# Patient Record
Sex: Female | Born: 1985
Health system: Southern US, Community
[De-identification: ages and names within clinical notes are randomized; demographics above are authoritative.]

## PROBLEM LIST (undated history)

## (undated) DIAGNOSIS — J302 Other seasonal allergic rhinitis: Secondary | ICD-10-CM

## (undated) DIAGNOSIS — R112 Nausea with vomiting, unspecified: Secondary | ICD-10-CM

## (undated) DIAGNOSIS — Z9889 Other specified postprocedural states: Secondary | ICD-10-CM

## (undated) DIAGNOSIS — I10 Essential (primary) hypertension: Secondary | ICD-10-CM

## (undated) DIAGNOSIS — K219 Gastro-esophageal reflux disease without esophagitis: Secondary | ICD-10-CM

---

## 2014-12-02 LAB — OB RESULTS CONSOLE GC/CHLAMYDIA
CHLAMYDIA, DNA PROBE: NEGATIVE
Gonorrhea: NEGATIVE

## 2014-12-02 LAB — OB RESULTS CONSOLE ABO/RH: RH Type: NEGATIVE

## 2014-12-02 LAB — OB RESULTS CONSOLE ANTIBODY SCREEN: ANTIBODY SCREEN: NEGATIVE

## 2014-12-02 LAB — OB RESULTS CONSOLE HIV ANTIBODY (ROUTINE TESTING): HIV: NONREACTIVE

## 2014-12-02 LAB — OB RESULTS CONSOLE RPR: RPR: NONREACTIVE

## 2014-12-02 LAB — OB RESULTS CONSOLE HEPATITIS B SURFACE ANTIGEN: HEP B S AG: NEGATIVE

## 2014-12-02 LAB — OB RESULTS CONSOLE RUBELLA ANTIBODY, IGM: RUBELLA: IMMUNE

## 2015-06-16 DIAGNOSIS — Z36 Encounter for antenatal screening of mother: Secondary | ICD-10-CM | POA: Diagnosis not present

## 2015-06-21 ENCOUNTER — Inpatient Hospital Stay (HOSPITAL_COMMUNITY)
Admission: AD | Admit: 2015-06-21 | Discharge: 2015-06-21 | Disposition: A | Discharge status: Home / Self Care | Payer: 59 | Source: Ambulatory Visit | Attending: Obstetrics and Gynecology | Admitting: Obstetrics and Gynecology

## 2015-06-21 ENCOUNTER — Encounter (HOSPITAL_COMMUNITY): Payer: Self-pay | Admitting: *Deleted

## 2015-06-21 DIAGNOSIS — Z3493 Encounter for supervision of normal pregnancy, unspecified, third trimester: Secondary | ICD-10-CM | POA: Insufficient documentation

## 2015-06-21 HISTORY — DX: Gastro-esophageal reflux disease without esophagitis: K21.9

## 2015-06-21 HISTORY — DX: Essential (primary) hypertension: I10

## 2015-06-21 LAB — COMPREHENSIVE METABOLIC PANEL
ALBUMIN: 3 g/dL — AB (ref 3.5–5.0)
ALK PHOS: 164 U/L — AB (ref 38–126)
ALT: 14 U/L (ref 14–54)
ANION GAP: 7 (ref 5–15)
AST: 25 U/L (ref 15–41)
BILIRUBIN TOTAL: 0.4 mg/dL (ref 0.3–1.2)
BUN: 13 mg/dL (ref 6–20)
CALCIUM: 8.8 mg/dL — AB (ref 8.9–10.3)
CO2: 21 mmol/L — ABNORMAL LOW (ref 22–32)
Chloride: 106 mmol/L (ref 101–111)
Creatinine, Ser: 0.89 mg/dL (ref 0.44–1.00)
GFR calc Af Amer: >60 mL/min (ref >60)
GLUCOSE: 119 mg/dL — AB (ref 65–99)
POTASSIUM: 3.5 mmol/L (ref 3.5–5.1)
Sodium: 134 mmol/L — ABNORMAL LOW (ref 135–145)
TOTAL PROTEIN: 6.7 g/dL (ref 6.5–8.1)

## 2015-06-21 LAB — CBC
HEMATOCRIT: 33.7 % — AB (ref 36.0–46.0)
HEMOGLOBIN: 11.1 g/dL — AB (ref 12.0–15.0)
MCH: 28 pg (ref 26.0–34.0)
MCHC: 32.9 g/dL (ref 30.0–36.0)
MCV: 84.9 fL (ref 78.0–100.0)
Platelets: 237 10*3/uL (ref 150–400)
RBC: 3.97 MIL/uL (ref 3.87–5.11)
RDW: 14.2 % (ref 11.5–15.5)
WBC: 11.5 10*3/uL — ABNORMAL HIGH (ref 4.0–10.5)

## 2015-06-21 LAB — PROTEIN / CREATININE RATIO, URINE
CREATININE, URINE: 95 mg/dL
PROTEIN CREATININE RATIO: 0.06 mg/mg{creat} (ref 0.00–0.15)
Total Protein, Urine: 6 mg/dL

## 2015-06-21 NOTE — MAU Note (Signed)
Started feeling contractions about 2000-2200 and went away with rest. 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 Active fetus; no vag bleeding or leaking.

## 2015-06-21 NOTE — Discharge Instructions (Signed)
Fetal Movement Counts °Patient Name: __________________________________________________ Patient Due Date: ____________________ °Performing a fetal movement count is highly recommended in high-risk pregnancies, but it is good for every pregnant woman to do. Your health care provider may ask you to start counting fetal movements at 28 weeks of the pregnancy. Fetal movements often increase: °· After eating a full meal. °· After physical activity. °· After eating or drinking something sweet or cold. °· At rest. °Pay attention to when you feel the baby is most active. This will help you notice a pattern of your baby's sleep and wake cycles and what factors contribute to an increase in fetal movement. It is important to perform a fetal movement count at the same time each day when your baby is normally most active.  °HOW TO COUNT FETAL MOVEMENTS °1. Find a quiet and comfortable area to sit or lie down on your left side. Lying on your left side provides the best blood and oxygen circulation to your baby. °2. Write down the day and time on a sheet of paper or in a journal. °3. Start counting kicks, flutters, swishes, rolls, or jabs in a 2-hour period. You should feel at least 10 movements within 2 hours. °4. If you do not feel 10 movements in 2 hours, wait 2-3 hours and count again. Look for a change in the pattern or not enough counts in 2 hours. °SEEK MEDICAL CARE IF: °· You feel less than 10 counts in 2 hours, tried twice. °· There is no movement in over an hour. °· The pattern is changing or taking longer each day to reach 10 counts in 2 hours. °· You feel the baby is not moving as he or she usually does. °Date: ____________ Movements: ____________ Start time: ____________ Finish time: ____________  °Date: ____________ Movements: ____________ Start time: ____________ Finish time: ____________ °Date: ____________ Movements: ____________ Start time: ____________ Finish time: ____________ °Date: ____________ Movements:  ____________ Start time: ____________ Finish time: ____________ °Date: ____________ Movements: ____________ Start time: ____________ Finish time: ____________ °Date: ____________ Movements: ____________ Start time: ____________ Finish time: ____________ °Date: ____________ Movements: ____________ Start time: ____________ Finish time: ____________ °Date: ____________ Movements: ____________ Start time: ____________ Finish time: ____________  °Date: ____________ Movements: ____________ Start time: ____________ Finish time: ____________ °Date: ____________ Movements: ____________ Start time: ____________ Finish time: ____________ °Date: ____________ Movements: ____________ Start time: ____________ Finish time: ____________ °Date: ____________ Movements: ____________ Start time: ____________ Finish time: ____________ °Date: ____________ Movements: ____________ Start time: ____________ Finish time: ____________ °Date: ____________ Movements: ____________ Start time: ____________ Finish time: ____________ °Date: ____________ Movements: ____________ Start time: ____________ Finish time: ____________  °Date: ____________ Movements: ____________ Start time: ____________ Finish time: ____________ °Date: ____________ Movements: ____________ Start time: ____________ Finish time: ____________ °Date: ____________ Movements: ____________ Start time: ____________ Finish time: ____________ °Date: ____________ Movements: ____________ Start time: ____________ Finish time: ____________ °Date: ____________ Movements: ____________ Start time: ____________ Finish time: ____________ °Date: ____________ Movements: ____________ Start time: ____________ Finish time: ____________ °Date: ____________ Movements: ____________ Start time: ____________ Finish time: ____________  °Date: ____________ Movements: ____________ Start time: ____________ Finish time: ____________ °Date: ____________ Movements: ____________ Start time: ____________ Finish  time: ____________ °Date: ____________ Movements: ____________ Start time: ____________ Finish time: ____________ °Date: ____________ Movements: ____________ Start time: ____________ Finish time: ____________ °Date: ____________ Movements: ____________ Start time: ____________ Finish time: ____________ °Date: ____________ Movements: ____________ Start time: ____________ Finish time: ____________ °Date: ____________ Movements: ____________ Start time: ____________ Finish time: ____________  °Date: ____________ Movements: ____________ Start time: ____________ Finish   time: ____________ Date: ____________ Movements: ____________ Start time: ____________ Finish time: ____________ Date: ____________ Movements: ____________ Start time: ____________ Finish time: ____________ Date: ____________ Movements: ____________ Start time: ____________ Finish time: ____________ Date: ____________ Movements: ____________ Start time: ____________ Finish time: ____________ Date: ____________ Movements: ____________ Start time: ____________ Finish time: ____________ Date: ____________ Movements: ____________ Start time: ____________ Finish time: ____________  Date: ____________ Movements: ____________ Start time: ____________ Finish time: ____________ Date: ____________ Movements: ____________ Start time: ____________ Finish time: ____________ Date: ____________ Movements: ____________ Start time: ____________ Finish time: ____________ Date: ____________ Movements: ____________ Start time: ____________ Finish time: ____________ Date: ____________ Movements: ____________ Start time: ____________ Finish time: ____________ Date: ____________ Movements: ____________ Start time: ____________ Finish time: ____________ Date: ____________ Movements: ____________ Start time: ____________ Finish time: ____________  Date: ____________ Movements: ____________ Start time: ____________ Finish time: ____________ Date: ____________  Movements: ____________ Start time: ____________ Finish time: ____________ Date: ____________ Movements: ____________ Start time: ____________ Finish time: ____________ Date: ____________ Movements: ____________ Start time: ____________ Finish time: ____________ Date: ____________ Movements: ____________ Start time: ____________ Finish time: ____________ Date: ____________ Movements: ____________ Start time: ____________ Finish time: ____________ Date: ____________ Movements: ____________ Start time: ____________ Finish time: ____________  Date: ____________ Movements: ____________ Start time: ____________ Finish time: ____________ Date: ____________ Movements: ____________ Start time: ____________ Finish time: ____________ Date: ____________ Movements: ____________ Start time: ____________ Finish time: ____________ Date: ____________ Movements: ____________ Start time: ____________ Finish time: ____________ Date: ____________ Movements: ____________ Start time: ____________ Finish time: ____________ Date: ____________ Movements: ____________ Start time: ____________ Finish time: ____________   This information is not intended to replace advice given to you by your health care provider. Make sure you discuss any questions you have with your health care provider.   Document Released: 05/23/2006 Document Revised: 05/14/2014 Document Reviewed: 02/18/2012 Elsevier Interactive Patient Education 2016 [employer] Braxton Hicks Contractions Contractions of the uterus can occur throughout pregnancy. Contractions are not always a sign that you are in labor.  WHAT ARE BRAXTON HICKS CONTRACTIONS?  Contractions that occur before labor are called Braxton Hicks contractions, or false labor. Toward the end of pregnancy (32-34 weeks), these contractions can develop more often and may become more forceful. This is not true labor because these contractions do not result in opening (dilatation) and thinning of  the cervix. They are sometimes difficult to tell apart from true labor because these contractions can be forceful and people have different pain tolerances. You should not feel embarrassed if you go to the hospital with false labor. Sometimes, the only way to tell if you are in true labor is for your health care provider to look for changes in the cervix. If there are no prenatal problems or other health problems associated with the pregnancy, it is completely safe to be sent home with false labor and await the onset of true labor. HOW CAN YOU TELL THE DIFFERENCE BETWEEN TRUE AND FALSE LABOR? False Labor  The contractions of false labor are usually shorter and not as hard as those of true labor.   The contractions are usually irregular.   The contractions are often felt in the front of the lower abdomen and in the groin.   The contractions may go away when you walk around or change positions while lying down.   The contractions get weaker and are shorter lasting as time goes on.   The contractions do not usually become progressively stronger, regular, and closer together as with true labor.  True Labor 5. Contractions in true   labor last 30-70 seconds, become very regular, usually become more intense, and increase in frequency.  6. The contractions do not go away with walking.  7. The discomfort is usually felt in the top of the uterus and spreads to the lower abdomen and low back.  8. True labor can be determined by your health care provider with an exam. This will show that the cervix is dilating and getting thinner.  WHAT TO REMEMBER  Keep up with your usual exercises and follow other instructions given by your health care provider.   Take medicines as directed by your health care provider.   Keep your regular prenatal appointments.   Eat and drink lightly if you think you are going into labor.   If Braxton Hicks contractions are making you uncomfortable:   Change  your position from lying down or resting to walking, or from walking to resting.   Sit and rest in a tub of warm water.   Drink 2-3 glasses of water. Dehydration may cause these contractions.   Do slow and deep breathing several times an hour.  WHEN SHOULD I SEEK IMMEDIATE MEDICAL CARE? Seek immediate medical care if:  Your contractions become stronger, more regular, and closer together.   You have fluid leaking or gushing from your vagina.   You have a fever.   You pass blood-tinged mucus.   You have vaginal bleeding.   You have continuous abdominal pain.   You have low back pain that you never had before.   You feel your baby's head pushing down and causing pelvic pressure.   Your baby is not moving as much as it used to.    This information is not intended to replace advice given to you by your health care provider. Make sure you discuss any questions you have with your health care provider.   Document Released: 04/23/2005 Document Revised: 04/28/2013 Document Reviewed: 02/02/2013 Elsevier Interactive Patient Education 2016 [employer] Pregnancy The third trimester is from week 29 through week 42, months 7 through 9. The third trimester is a time when the fetus is growing rapidly. At the end of the ninth month, the fetus is about 20 inches in length and weighs 6-10 pounds.  BODY CHANGES Your body goes through many changes during pregnancy. The changes vary from woman to woman.   Your weight will continue to increase. You can expect to gain 25-35 pounds (11-16 kg) by the end of the pregnancy.  You may begin to get stretch marks on your hips, abdomen, and breasts.  You may urinate more often because the fetus is moving lower into your pelvis and pressing on your bladder.  You may develop or continue to have heartburn as a result of your pregnancy.  You may develop constipation because certain hormones are causing the muscles that push  waste through your intestines to slow down.  You may develop hemorrhoids or swollen, bulging veins (varicose veins).  You may have pelvic pain because of the weight gain and pregnancy hormones relaxing your joints between the bones in your pelvis. Backaches may result from overexertion of the muscles supporting your posture.  You may have changes in your hair. These can include thickening of your hair, rapid growth, and changes in texture. Some women also have hair loss during or after pregnancy, or hair that feels dry or thin. Your hair will most likely return to normal after your baby is born.  Your breasts will continue to grow and  be tender. A yellow discharge may leak from your breasts called colostrum.  Your belly button may stick out.  You may feel short of breath because of your expanding uterus.  You may notice the fetus "dropping," or moving lower in your abdomen.  You may have a bloody mucus discharge. This usually occurs a few days to a week before labor begins.  Your cervix becomes thin and soft (effaced) near your due date. WHAT TO EXPECT AT YOUR PRENATAL EXAMS  You will have prenatal exams every 2 weeks until week 36. Then, you will have weekly prenatal exams. During a routine prenatal visit: 9. You will be weighed to make sure you and the fetus are growing normally. 10. Your blood pressure is taken. 11. Your abdomen will be measured to track your baby's growth. 12. The fetal heartbeat will be listened to. 13. Any test results from the previous visit will be discussed. 14. You may have a cervical check near your due date to see if you have effaced. At around 36 weeks, your caregiver will check your cervix. At the same time, your caregiver will also perform a test on the secretions of the vaginal tissue. This test is to determine if a type of bacteria, Group B streptococcus, is present. Your caregiver will explain this further. Your caregiver may ask you:  What your birth  plan is.  How you are feeling.  If you are feeling the baby move.  If you have had any abnormal symptoms, such as leaking fluid, bleeding, severe headaches, or abdominal cramping.  If you are using any tobacco products, including cigarettes, chewing tobacco, and electronic cigarettes.  If you have any questions. Other tests or screenings that may be performed during your third trimester include:  Blood tests that check for low iron levels (anemia).  Fetal testing to check the health, activity level, and growth of the fetus. Testing is done if you have certain medical conditions or if there are problems during the pregnancy.  HIV (human immunodeficiency virus) testing. If you are at high risk, you may be screened for HIV during your third trimester of pregnancy. FALSE LABOR You may feel small, irregular contractions that eventually go away. These are called Braxton Hicks contractions, or false labor. Contractions may last for hours, days, or even weeks before true labor sets in. If contractions come at regular intervals, intensify, or become painful, it is best to be seen by your caregiver.  SIGNS OF LABOR   Menstrual-like cramps.  Contractions that are 5 minutes apart or less.  Contractions that start on the top of the uterus and spread down to the lower abdomen and back.  A sense of increased pelvic pressure or back pain.  A watery or bloody mucus discharge that comes from the vagina. If you have any of these signs before the 37th week of pregnancy, call your caregiver right away. You need to go to the hospital to get checked immediately. HOME CARE INSTRUCTIONS   Avoid all smoking, herbs, alcohol, and unprescribed drugs. These chemicals affect the formation and growth of the baby.  Do not use any tobacco products, including cigarettes, chewing tobacco, and electronic cigarettes. If you need help quitting, ask your health care provider. You may receive counseling support and other  resources to help you quit.  Follow your caregiver's instructions regarding medicine use. There are medicines that are either safe or unsafe to take during pregnancy.  Exercise only as directed by your caregiver. Experiencing uterine cramps is  a good sign to stop exercising.  Continue to eat regular, healthy meals.  Wear a good support bra for breast tenderness.  Do not use hot tubs, steam rooms, or saunas.  Wear your seat belt at all times when driving.  Avoid raw meat, uncooked cheese, cat litter boxes, and soil used by cats. These carry germs that can cause birth defects in the baby.  Take your prenatal vitamins.  Take 1500-2000 mg of calcium daily starting at the 20th week of pregnancy until you deliver your baby.  Try taking a stool softener (if your caregiver approves) if you develop constipation. Eat more high-fiber foods, such as fresh vegetables or fruit and whole grains. Drink plenty of fluids to keep your urine clear or pale yellow.  Take warm sitz baths to soothe any pain or discomfort caused by hemorrhoids. Use hemorrhoid cream if your caregiver approves.  If you develop varicose veins, wear support hose. Elevate your feet for 15 minutes, 3-4 times a day. Limit salt in your diet.  Avoid heavy lifting, wear low heal shoes, and practice good posture.  Rest a lot with your legs elevated if you have leg cramps or low back pain.  Visit your dentist if you have not gone during your pregnancy. Use a soft toothbrush to brush your teeth and be gentle when you floss.  A sexual relationship may be continued unless your caregiver directs you otherwise.  Do not travel far distances unless it is absolutely necessary and only with the approval of your caregiver.  Take prenatal classes to understand, practice, and ask questions about the labor and delivery.  Make a trial run to the hospital.  Pack your hospital bag.  Prepare the baby's nursery.  Continue to go to all your  prenatal visits as directed by your caregiver. SEEK MEDICAL CARE IF:  You are unsure if you are in labor or if your water has broken.  You have dizziness.  You have mild pelvic cramps, pelvic pressure, or nagging pain in your abdominal area.  You have persistent nausea, vomiting, or diarrhea.  You have a bad smelling vaginal discharge.  You have pain with urination. SEEK IMMEDIATE MEDICAL CARE IF:   You have a fever.  You are leaking fluid from your vagina.  You have spotting or bleeding from your vagina.  You have severe abdominal cramping or pain.  You have rapid weight loss or gain.  You have shortness of breath with chest pain.  You notice sudden or extreme swelling of your face, hands, ankles, feet, or legs.  You have not felt your baby move in over an hour.  You have severe headaches that do not go away with medicine.  You have vision changes.   This information is not intended to replace advice given to you by your health care provider. Make sure you discuss any questions you have with your health care provider.   Document Released: 04/17/2001 Document Revised: 05/14/2014 Document Reviewed: 06/24/2012 Elsevier Interactive Patient Education [employer].

## 2015-07-04 NOTE — H&P (Signed)
Vicki Rodriguez is a 30 y.o. female presenting for repeat C/S with BTL; declines TOLAC.  Antepartum course uncomplicated.  Rh negative.  GBS negative.  Maternal Medical History:  Prenatal complications: no prenatal complications Prenatal Complications - Diabetes: none.    OB History    Gravida Para Term Preterm AB TAB SAB Ectopic Multiple Living   2 1 1       1      Past Medical History  Diagnosis Date  . Hypertension     while in nursing school  . GERD (gastroesophageal reflux disease)    Past Surgical History  Procedure Laterality Date  . Cesarean section     Family History: family history is not on file. Social History:  reports that she has never smoked. She does not have any smokeless tobacco history on file. She reports that she does not drink alcohol or use illicit drugs.   Prenatal Transfer Tool  Maternal Diabetes: No Genetic Screening: Normal Maternal Ultrasounds/Referrals: Normal Fetal Ultrasounds or other Referrals:  None Maternal Substance Abuse:  No Significant Maternal Medications:  None Significant Maternal Lab Results:  Lab values include: Group B Strep negative, Rh negative Other Comments:  None  ROS    There were no vitals taken for this visit. Maternal Exam:  Abdomen: Patient reports no abdominal tenderness. Surgical scars: low transverse.   Fundal height is c/w dates.   Estimated fetal weight is 8#.       Physical Exam  Constitutional: She is oriented to person, place, and time. She appears well-developed and well-nourished.  GI: Soft. There is no rebound and no guarding.  Neurological: She is alert and oriented to person, place, and time.  Skin: Skin is warm and dry.  Psychiatric: She has a normal mood and affect. Her behavior is normal.    Prenatal labs: ABO, Rh:   Antibody:   Rubella:   RPR:    HBsAg:    HIV:    GBS:     Assessment/Plan: 31yo G2P1001 at 39 weeks for repeat C/S and BTL -Patient is counseled re: risk of bleeding,  infection, scarring and damage to surrounding structures.  She understands the risk of failure/ectopic and regret with BTL.  All questions were answered and the patient wishes to proceed.  Vicki Rodriguez 07/04/2015, 9:22 PM

## 2015-07-05 ENCOUNTER — Encounter (HOSPITAL_COMMUNITY): Payer: Self-pay

## 2015-07-05 NOTE — Patient Instructions (Signed)
Your procedure is scheduled on:  Tomorrow, July 07, 2015  Enter through the Main Entrance of [facility] at:  6:00 AM  Pick up the phone at the desk and dial [phone].  Call this number if you have problems the morning of surgery: [phone].  Remember:  Do NOT eat food or drink after:  Midnight tonight  Take these medicines the morning of surgery with a SIP OF WATER:  Zantac  Do NOT wear jewelry (body piercing), metal hair clips/bobby pins, or nail polish. Do NOT wear lotions, powders, or perfumes.  You may wear deoderant. Do NOT shave for 48 hours prior to surgery. Do NOT bring valuables to the hospital.  Leave suitcase in car.  After surgery it may be brought to your room.  For patients admitted to the hospital, checkout time is 11:00 AM the day of discharge.

## 2015-07-06 ENCOUNTER — Encounter (HOSPITAL_COMMUNITY)
Admission: RE | Admit: 2015-07-06 | Discharge: 2015-07-06 | Disposition: A | Discharge status: Home / Self Care | Payer: 59 | Source: Ambulatory Visit | Attending: Obstetrics & Gynecology | Admitting: Obstetrics & Gynecology

## 2015-07-06 ENCOUNTER — Encounter (HOSPITAL_COMMUNITY): Payer: Self-pay

## 2015-07-06 DIAGNOSIS — O9962 Diseases of the digestive system complicating childbirth: Secondary | ICD-10-CM | POA: Diagnosis not present

## 2015-07-06 DIAGNOSIS — O34211 Maternal care for low transverse scar from previous cesarean delivery: Secondary | ICD-10-CM | POA: Diagnosis not present

## 2015-07-06 DIAGNOSIS — Z3A39 39 weeks gestation of pregnancy: Secondary | ICD-10-CM | POA: Diagnosis not present

## 2015-07-06 DIAGNOSIS — O26893 Other specified pregnancy related conditions, third trimester: Secondary | ICD-10-CM | POA: Diagnosis not present

## 2015-07-06 DIAGNOSIS — K219 Gastro-esophageal reflux disease without esophagitis: Secondary | ICD-10-CM | POA: Diagnosis not present

## 2015-07-06 DIAGNOSIS — Z6791 Unspecified blood type, Rh negative: Secondary | ICD-10-CM | POA: Diagnosis not present

## 2015-07-06 HISTORY — DX: Other specified postprocedural states: R11.2

## 2015-07-06 HISTORY — DX: Nausea with vomiting, unspecified: Z98.890

## 2015-07-06 LAB — CBC
HEMATOCRIT: 32.5 % — AB (ref 36.0–46.0)
HEMOGLOBIN: 10.6 g/dL — AB (ref 12.0–15.0)
MCH: 27.2 pg (ref 26.0–34.0)
MCHC: 32.6 g/dL (ref 30.0–36.0)
MCV: 83.5 fL (ref 78.0–100.0)
Platelets: 198 10*3/uL (ref 150–400)
RBC: 3.89 MIL/uL (ref 3.87–5.11)
RDW: 14.1 % (ref 11.5–15.5)
WBC: 10.9 10*3/uL — AB (ref 4.0–10.5)

## 2015-07-07 ENCOUNTER — Inpatient Hospital Stay (HOSPITAL_COMMUNITY): Payer: 59 | Admitting: Anesthesiology

## 2015-07-07 ENCOUNTER — Encounter (HOSPITAL_COMMUNITY)
Admission: RE | Disposition: A | Discharge status: Home / Self Care | Payer: Self-pay | Source: Ambulatory Visit | Attending: Obstetrics & Gynecology

## 2015-07-07 ENCOUNTER — Encounter (HOSPITAL_COMMUNITY): Payer: Self-pay | Admitting: Obstetrics

## 2015-07-07 ENCOUNTER — Inpatient Hospital Stay (HOSPITAL_COMMUNITY)
Admission: RE | Admit: 2015-07-07 | Discharge: 2015-07-10 | DRG: 766 | Disposition: A | Discharge status: Home / Self Care | Payer: 59 | Source: Ambulatory Visit | Attending: Obstetrics & Gynecology | Admitting: Obstetrics & Gynecology

## 2015-07-07 DIAGNOSIS — O34211 Maternal care for low transverse scar from previous cesarean delivery: Secondary | ICD-10-CM | POA: Diagnosis not present

## 2015-07-07 DIAGNOSIS — O26893 Other specified pregnancy related conditions, third trimester: Secondary | ICD-10-CM | POA: Diagnosis present

## 2015-07-07 DIAGNOSIS — K219 Gastro-esophageal reflux disease without esophagitis: Secondary | ICD-10-CM | POA: Diagnosis present

## 2015-07-07 DIAGNOSIS — Z3A39 39 weeks gestation of pregnancy: Secondary | ICD-10-CM

## 2015-07-07 DIAGNOSIS — Z98891 History of uterine scar from previous surgery: Secondary | ICD-10-CM

## 2015-07-07 DIAGNOSIS — O9962 Diseases of the digestive system complicating childbirth: Secondary | ICD-10-CM | POA: Diagnosis present

## 2015-07-07 DIAGNOSIS — Z6791 Unspecified blood type, Rh negative: Secondary | ICD-10-CM | POA: Diagnosis not present

## 2015-07-07 HISTORY — DX: Other seasonal allergic rhinitis: J30.2

## 2015-07-07 LAB — RPR: RPR Ser Ql: NONREACTIVE

## 2015-07-07 SURGERY — Surgical Case
Anesthesia: Spinal | Site: Abdomen | Laterality: Bilateral

## 2015-07-07 MED ORDER — PRENATAL MULTIVITAMIN CH
1.0000 | ORAL_TABLET | Freq: Every day | ORAL | Status: DC
  Administered 2015-07-08 – 2015-07-09 (×2): 1 via ORAL
  Filled 2015-07-07 (×2): qty 1

## 2015-07-07 MED ORDER — LACTATED RINGERS IV SOLN: INTRAVENOUS | Status: DC

## 2015-07-07 MED ORDER — ACETAMINOPHEN 325 MG PO TABS: 650.0000 mg | ORAL_TABLET | ORAL | Status: DC | PRN

## 2015-07-07 MED ORDER — FENTANYL CITRATE (PF) 100 MCG/2ML IJ SOLN
INTRAMUSCULAR | Status: DC | PRN
  Administered 2015-07-07: 12.5 ug via INTRAVENOUS

## 2015-07-07 MED ORDER — MEPERIDINE HCL 25 MG/ML IJ SOLN: 6.2500 mg | INTRAMUSCULAR | Status: DC | PRN

## 2015-07-07 MED ORDER — LACTATED RINGERS IV SOLN
INTRAVENOUS | Status: DC
  Administered 2015-07-07 (×2): via INTRAVENOUS

## 2015-07-07 MED ORDER — OXYTOCIN 10 UNIT/ML IJ SOLN: 2.5000 [IU]/h | INTRAVENOUS | Status: AC

## 2015-07-07 MED ORDER — LACTATED RINGERS IV SOLN
INTRAVENOUS | Status: DC
Start: 2015-07-07 — End: 2015-07-10
  Administered 2015-07-07: 16:00:00 via INTRAVENOUS

## 2015-07-07 MED ORDER — LACTATED RINGERS IV SOLN
INTRAVENOUS | Status: DC | PRN
  Administered 2015-07-07: 08:00:00 via INTRAVENOUS

## 2015-07-07 MED ORDER — MORPHINE SULFATE (PF) 0.5 MG/ML IJ SOLN
INTRAMUSCULAR | Status: AC
  Filled 2015-07-07: qty 10

## 2015-07-07 MED ORDER — SCOPOLAMINE 1 MG/3DAYS TD PT72
MEDICATED_PATCH | TRANSDERMAL | Status: AC
  Administered 2015-07-07: 1.5 mg via TRANSDERMAL
  Filled 2015-07-07: qty 1

## 2015-07-07 MED ORDER — SCOPOLAMINE 1 MG/3DAYS TD PT72
1.0000 | MEDICATED_PATCH | Freq: Once | TRANSDERMAL | Status: DC
  Filled 2015-07-07: qty 1

## 2015-07-07 MED ORDER — SENNOSIDES-DOCUSATE SODIUM 8.6-50 MG PO TABS
2.0000 | ORAL_TABLET | ORAL | Status: DC
  Administered 2015-07-08 – 2015-07-10 (×3): 2 via ORAL
  Filled 2015-07-07 (×3): qty 2

## 2015-07-07 MED ORDER — ONDANSETRON HCL 4 MG/2ML IJ SOLN: 4.0000 mg | Freq: Three times a day (TID) | INTRAMUSCULAR | Status: DC | PRN

## 2015-07-07 MED ORDER — SIMETHICONE 80 MG PO CHEW
80.0000 mg | CHEWABLE_TABLET | ORAL | Status: DC | PRN
Start: 2015-07-07 — End: 2015-07-10

## 2015-07-07 MED ORDER — OXYCODONE-ACETAMINOPHEN 5-325 MG PO TABS
1.0000 | ORAL_TABLET | ORAL | Status: DC | PRN
  Administered 2015-07-07 – 2015-07-08 (×3): 1 via ORAL
  Filled 2015-07-07 (×4): qty 1

## 2015-07-07 MED ORDER — SODIUM CHLORIDE 0.9% FLUSH: 3.0000 mL | INTRAVENOUS | Status: DC | PRN

## 2015-07-07 MED ORDER — CEFAZOLIN SODIUM-DEXTROSE 2-3 GM-% IV SOLR
INTRAVENOUS | Status: AC
  Filled 2015-07-07: qty 50

## 2015-07-07 MED ORDER — IBUPROFEN 600 MG PO TABS
600.0000 mg | ORAL_TABLET | Freq: Four times a day (QID) | ORAL | Status: DC
Start: 2015-07-07 — End: 2015-07-10
  Administered 2015-07-08 – 2015-07-10 (×10): 600 mg via ORAL
  Filled 2015-07-07 (×10): qty 1

## 2015-07-07 MED ORDER — LANOLIN HYDROUS EX OINT: 1.0000 "application " | TOPICAL_OINTMENT | CUTANEOUS | Status: DC | PRN

## 2015-07-07 MED ORDER — PHENYLEPHRINE 8 MG IN D5W 100 ML (0.08MG/ML) PREMIX OPTIME
INJECTION | INTRAVENOUS | Status: AC
  Filled 2015-07-07: qty 100

## 2015-07-07 MED ORDER — OXYTOCIN 10 UNIT/ML IJ SOLN
INTRAMUSCULAR | Status: AC
  Filled 2015-07-07: qty 4

## 2015-07-07 MED ORDER — KETOROLAC TROMETHAMINE 30 MG/ML IJ SOLN
30.0000 mg | Freq: Four times a day (QID) | INTRAMUSCULAR | Status: AC | PRN
  Administered 2015-07-07 (×2): 30 mg via INTRAVENOUS
  Filled 2015-07-07: qty 1

## 2015-07-07 MED ORDER — NALOXONE HCL 2 MG/2ML IJ SOSY
1.0000 ug/kg/h | PREFILLED_SYRINGE | INTRAMUSCULAR | Status: DC | PRN
Start: 2015-07-07 — End: 2015-07-10
  Filled 2015-07-07: qty 2

## 2015-07-07 MED ORDER — ZOLPIDEM TARTRATE 5 MG PO TABS: 5.0000 mg | ORAL_TABLET | Freq: Every evening | ORAL | Status: DC | PRN

## 2015-07-07 MED ORDER — BUPIVACAINE IN DEXTROSE 0.75-8.25 % IT SOLN
INTRATHECAL | Status: DC | PRN
Start: 2015-07-07 — End: 2015-07-07
  Administered 2015-07-07: 1.4 mL via INTRATHECAL

## 2015-07-07 MED ORDER — CEFAZOLIN SODIUM-DEXTROSE 2-3 GM-% IV SOLR
2.0000 g | INTRAVENOUS | Status: AC
  Administered 2015-07-07: 2 g via INTRAVENOUS

## 2015-07-07 MED ORDER — PHENYLEPHRINE 8 MG IN D5W 100 ML (0.08MG/ML) PREMIX OPTIME
INJECTION | INTRAVENOUS | Status: DC | PRN
  Administered 2015-07-07: 40 ug/min via INTRAVENOUS

## 2015-07-07 MED ORDER — NALBUPHINE HCL 10 MG/ML IJ SOLN: 5.0000 mg | INTRAMUSCULAR | Status: DC | PRN

## 2015-07-07 MED ORDER — SIMETHICONE 80 MG PO CHEW
80.0000 mg | CHEWABLE_TABLET | ORAL | Status: DC
  Administered 2015-07-08 – 2015-07-10 (×3): 80 mg via ORAL
  Filled 2015-07-07 (×3): qty 1

## 2015-07-07 MED ORDER — DIPHENHYDRAMINE HCL 50 MG/ML IJ SOLN: 12.5000 mg | INTRAMUSCULAR | Status: DC | PRN

## 2015-07-07 MED ORDER — ONDANSETRON HCL 4 MG/2ML IJ SOLN
INTRAMUSCULAR | Status: AC
  Filled 2015-07-07: qty 2

## 2015-07-07 MED ORDER — MENTHOL 3 MG MT LOZG: 1.0000 | LOZENGE | OROMUCOSAL | Status: DC | PRN

## 2015-07-07 MED ORDER — FENTANYL CITRATE (PF) 100 MCG/2ML IJ SOLN: 25.0000 ug | INTRAMUSCULAR | Status: DC | PRN

## 2015-07-07 MED ORDER — TETANUS-DIPHTH-ACELL PERTUSSIS 5-2.5-18.5 LF-MCG/0.5 IM SUSP: 0.5000 mL | Freq: Once | INTRAMUSCULAR | Status: DC

## 2015-07-07 MED ORDER — ERYTHROMYCIN 5 MG/GM OP OINT
TOPICAL_OINTMENT | OPHTHALMIC | Status: AC
  Filled 2015-07-07: qty 1

## 2015-07-07 MED ORDER — NALOXONE HCL 0.4 MG/ML IJ SOLN: 0.4000 mg | INTRAMUSCULAR | Status: DC | PRN

## 2015-07-07 MED ORDER — NALBUPHINE HCL 10 MG/ML IJ SOLN: 5.0000 mg | Freq: Once | INTRAMUSCULAR | Status: DC | PRN

## 2015-07-07 MED ORDER — DIPHENHYDRAMINE HCL 25 MG PO CAPS: 25.0000 mg | ORAL_CAPSULE | ORAL | Status: DC | PRN

## 2015-07-07 MED ORDER — DIPHENHYDRAMINE HCL 25 MG PO CAPS: 25.0000 mg | ORAL_CAPSULE | Freq: Four times a day (QID) | ORAL | Status: DC | PRN

## 2015-07-07 MED ORDER — KETOROLAC TROMETHAMINE 30 MG/ML IJ SOLN
INTRAMUSCULAR | Status: AC
  Administered 2015-07-07: 30 mg via INTRAVENOUS
  Filled 2015-07-07: qty 1

## 2015-07-07 MED ORDER — VITAMIN K1 1 MG/0.5ML IJ SOLN
INTRAMUSCULAR | Status: AC
  Filled 2015-07-07: qty 0.5

## 2015-07-07 MED ORDER — OXYTOCIN 10 UNIT/ML IJ SOLN
40.0000 [IU] | INTRAMUSCULAR | Status: DC | PRN
  Administered 2015-07-07: 40 [IU] via INTRAVENOUS

## 2015-07-07 MED ORDER — FENTANYL CITRATE (PF) 100 MCG/2ML IJ SOLN
INTRAMUSCULAR | Status: AC
  Filled 2015-07-07: qty 2

## 2015-07-07 MED ORDER — WITCH HAZEL-GLYCERIN EX PADS: 1.0000 "application " | MEDICATED_PAD | CUTANEOUS | Status: DC | PRN

## 2015-07-07 MED ORDER — SCOPOLAMINE 1 MG/3DAYS TD PT72
1.0000 | MEDICATED_PATCH | Freq: Once | TRANSDERMAL | Status: DC
  Administered 2015-07-07: 1.5 mg via TRANSDERMAL

## 2015-07-07 MED ORDER — OXYCODONE-ACETAMINOPHEN 5-325 MG PO TABS
2.0000 | ORAL_TABLET | ORAL | Status: DC | PRN
  Administered 2015-07-08 – 2015-07-10 (×8): 2 via ORAL
  Filled 2015-07-07 (×6): qty 2

## 2015-07-07 MED ORDER — MORPHINE SULFATE (PF) 0.5 MG/ML IJ SOLN
INTRAMUSCULAR | Status: DC | PRN
  Administered 2015-07-07: .1 mg via INTRATHECAL

## 2015-07-07 MED ORDER — ONDANSETRON HCL 4 MG/2ML IJ SOLN
INTRAMUSCULAR | Status: DC | PRN
  Administered 2015-07-07: 4 mg via INTRAVENOUS

## 2015-07-07 MED ORDER — KETOROLAC TROMETHAMINE 30 MG/ML IJ SOLN: 30.0000 mg | Freq: Four times a day (QID) | INTRAMUSCULAR | Status: AC | PRN

## 2015-07-07 MED ORDER — SIMETHICONE 80 MG PO CHEW
80.0000 mg | CHEWABLE_TABLET | Freq: Three times a day (TID) | ORAL | Status: DC
  Administered 2015-07-07 – 2015-07-10 (×6): 80 mg via ORAL
  Filled 2015-07-07 (×6): qty 1

## 2015-07-07 MED ORDER — SODIUM CHLORIDE 0.9 % IR SOLN
Status: DC | PRN
  Administered 2015-07-07: 1000 mL

## 2015-07-07 MED ORDER — DIBUCAINE 1 % RE OINT: 1.0000 "application " | TOPICAL_OINTMENT | RECTAL | Status: DC | PRN

## 2015-07-07 SURGICAL SUPPLY — 35 items
BENZOIN TINCTURE PRP APPL 2/3 (GAUZE/BANDAGES/DRESSINGS) ×3 IMPLANT
CLAMP CORD UMBIL (MISCELLANEOUS) IMPLANT
CLOSURE STERI STRIP 1/2 X4 (GAUZE/BANDAGES/DRESSINGS) ×3 IMPLANT
CLOTH BEACON ORANGE TIMEOUT ST (SAFETY) ×3 IMPLANT
DRSG OPSITE POSTOP 4X10 (GAUZE/BANDAGES/DRESSINGS) ×3 IMPLANT
DURAPREP 26ML APPLICATOR (WOUND CARE) ×3 IMPLANT
ELECT REM PT RETURN 9FT ADLT (ELECTROSURGICAL) ×3
ELECTRODE REM PT RTRN 9FT ADLT (ELECTROSURGICAL) ×1 IMPLANT
EXTRACTOR VACUUM KIWI (MISCELLANEOUS) IMPLANT
EXTRACTOR VACUUM M CUP 4 TUBE (SUCTIONS) IMPLANT
EXTRACTOR VACUUM M CUP 4' TUBE (SUCTIONS)
GLOVE BIO SURGEON STRL SZ 6 (GLOVE) ×3 IMPLANT
GLOVE BIOGEL PI IND STRL 6 (GLOVE) ×2 IMPLANT
GLOVE BIOGEL PI IND STRL 7.0 (GLOVE) ×1 IMPLANT
GLOVE BIOGEL PI INDICATOR 6 (GLOVE) ×4
GLOVE BIOGEL PI INDICATOR 7.0 (GLOVE) ×2
GOWN STRL REUS W/TWL LRG LVL3 (GOWN DISPOSABLE) ×6 IMPLANT
KIT ABG SYR 3ML LUER SLIP (SYRINGE) ×3 IMPLANT
LIQUID BAND (GAUZE/BANDAGES/DRESSINGS) IMPLANT
NEEDLE HYPO 25X5/8 SAFETYGLIDE (NEEDLE) ×3 IMPLANT
NS IRRIG 1000ML POUR BTL (IV SOLUTION) ×3 IMPLANT
PACK C SECTION WH (CUSTOM PROCEDURE TRAY) ×3 IMPLANT
PAD OB MATERNITY 4.3X12.25 (PERSONAL CARE ITEMS) ×3 IMPLANT
PENCIL SMOKE EVAC W/HOLSTER (ELECTROSURGICAL) ×3 IMPLANT
STRIP CLOSURE SKIN 1/2X4 (GAUZE/BANDAGES/DRESSINGS) ×2 IMPLANT
SUT CHROMIC 0 CTX 36 (SUTURE) ×9 IMPLANT
SUT MON AB 2-0 CT1 27 (SUTURE) ×3 IMPLANT
SUT PDS AB 0 CT1 27 (SUTURE) IMPLANT
SUT PLAIN 0 NONE (SUTURE) IMPLANT
SUT PLAIN 2 0 (SUTURE) ×2
SUT PLAIN ABS 2-0 CT1 27XMFL (SUTURE) ×1 IMPLANT
SUT VIC AB 0 CT1 36 (SUTURE) IMPLANT
SUT VIC AB 4-0 KS 27 (SUTURE) IMPLANT
TOWEL OR 17X24 6PK STRL BLUE (TOWEL DISPOSABLE) ×3 IMPLANT
TRAY FOLEY CATH SILVER 14FR (SET/KITS/TRAYS/PACK) IMPLANT

## 2015-07-07 NOTE — Lactation Note (Signed)
This note was copied from a baby's chart. Lactation Consultation Note  Baby 7 hours and breastfed x3.  NICU RN. Mother states she got the flu after leaving the hospital w/ her first child and was not allowed to care for her so started pumping and had trouble w/ her milk supply. States she used a #20NS with first child due to soreness. Encouraged her to call if she starts getting sore. States she knows how to hand express. Encouraged STS and feeding on demand. Mom encouraged to feed baby 8-12 times/24 hours and with feeding cues.  Mom made aware of O/P services, breastfeeding support groups, community resources, and our phone # for post-discharge questions.  Mother has breast pump kit in room.   Patient Name: Vicki Rodriguez [id number] Date: 07/07/2015 Reason for consult: Initial assessment   Maternal Data Has patient been taught Hand Expression?: Yes Does the patient have breastfeeding experience prior to this delivery?: Yes  Feeding Feeding Type: Breast Fed Length of feed: 25 min  LATCH Score/Interventions                      Lactation Tools Discussed/Used     Consult Status Consult Status: Follow-up Date: 07/08/15 Follow-up type: In-patient    Vivianne Master [facility] 07/07/2015, 3:13 PM

## 2015-07-07 NOTE — Anesthesia Postprocedure Evaluation (Signed)
Anesthesia Post Note  Patient: Gracin Abramyan  Procedure(s) Performed: Procedure(s) (LRB): REPEAT CESAREAN SECTION (Bilateral)  Patient location during evaluation: PACU Anesthesia Type: Spinal Level of consciousness: oriented and awake and alert Pain management: pain level controlled Vital Signs Assessment: post-procedure vital signs reviewed and stable Respiratory status: spontaneous breathing, respiratory function stable and patient connected to nasal cannula oxygen Cardiovascular status: blood pressure returned to baseline and stable Postop Assessment: no headache and no backache Anesthetic complications: no    Last Vitals:  Filed Vitals:   07/07/15 0940 07/07/15 0956  BP:  129/79  Pulse: 58 61  Temp:  36.2 C  Resp: 21 18    Last Pain: There were no vitals filed for this visit.               Montez Hageman

## 2015-07-07 NOTE — Addendum Note (Signed)
Addendum  created 07/07/15 1542 by Hewitt Blade, CRNA   Modules edited: Clinical Notes   Clinical Notes:  File: [id number]

## 2015-07-07 NOTE — Anesthesia Postprocedure Evaluation (Signed)
Anesthesia Post Note  Patient: Vicki Rodriguez  Procedure(s) Performed: Procedure(s) (LRB): REPEAT CESAREAN SECTION (Bilateral)  Patient location during evaluation: Mother Baby Anesthesia Type: Spinal Level of consciousness: awake Pain management: pain level controlled Vital Signs Assessment: post-procedure vital signs reviewed and stable Respiratory status: spontaneous breathing Cardiovascular status: stable Postop Assessment: patient able to bend at knees, adequate PO intake and no signs of nausea or vomiting Anesthetic complications: no    Last Vitals:  Filed Vitals:   07/07/15 1200 07/07/15 1300  BP: 120/79 122/72  Pulse: 60 62  Temp:    Resp: 18 18    Last Pain: There were no vitals filed for this visit.               Jochebed Bills Hrist[place]

## 2015-07-07 NOTE — Consult Note (Signed)
Neonatology Note:   Attendance at C-section:    I was asked by Dr. Lynnette Caffey to attend this repeat C/S at term. The mother is a G2P1 O neg, GBS neg with an uncomplicated pregnancy. ROM at delivery, fluid clear. Infant vigorous with good spontaneous cry and tone. Needed  bulb suctioning several times for thin secretions; lungs with rales initially, but clearing by 5 minutes. Her color was pale but very pink in room air. We noted some minimal subcostal retractions at 5 min, so performed chest PT, after which the retractions had almost disappeared. Ap 8/9. Instructed transition nurse to observe closely and to call me for any problems. Spoke with the parents. To CN to care of Pediatrician.   Real Cons, MD

## 2015-07-07 NOTE — Transfer of Care (Signed)
Immediate Anesthesia Transfer of Care Note  Patient: Vicki Rodriguez  Procedure(s) Performed: Procedure(s) with comments: REPEAT CESAREAN SECTION (Bilateral) - edc 07/13/15   Patient Location: PACU  Anesthesia Type:Spinal  Level of Consciousness: awake, alert  and oriented  Airway & Oxygen Therapy: Patient Spontanous Breathing  Post-op Assessment: Report given to RN and Post -op Vital signs reviewed and stable  Post vital signs: Reviewed and stable  Last Vitals:  Filed Vitals:   07/07/15 0623  BP: 143/94  Pulse: 89  Temp: 36.7 C  Resp: 20    Complications: No apparent anesthesia complications

## 2015-07-07 NOTE — Progress Notes (Signed)
No change to H&P except patient does not want BTL.  Linda Hedges, DO

## 2015-07-07 NOTE — Op Note (Signed)
Vicki Rodriguez PROCEDURE DATE: 07/07/2015  PREOPERATIVE DIAGNOSIS: Intrauterine pregnancy at  [redacted]w[redacted]d weeks gestation, previous C/S x 1  POSTOPERATIVE DIAGNOSIS: The same  PROCEDURE: Repeat Low Transverse Cesarean Section  SURGEON:  Dr. Linda Hedges  INDICATIONS: Vicki Rodriguez is a 30 y.o. G2P1001 at [redacted]w[redacted]d scheduled for cesarean section secondary to desire for repeat.  The risks of cesarean section discussed with the patient included but were not limited to: bleeding which may require transfusion or reoperation; infection which may require antibiotics; injury to bowel, bladder, ureters or other surrounding organs; injury to the fetus; need for additional procedures including hysterectomy in the event of a life-threatening hemorrhage; placental abnormalities wth subsequent pregnancies, incisional problems, thromboembolic phenomenon and other postoperative/anesthesia complications. The patient concurred with the proposed plan, giving informed written consent for the procedure.    FINDINGS:  Viable female infant in cephalic presentation, APGARs 8,9:  Weight pending  Clear amniotic fluid.  Intact placenta, three vessel cord.  Grossly normal uterus, ovaries and fallopian tubes. .   ANESTHESIA:   Spinal ESTIMATED BLOOD LOSS: 600 ml SPECIMENS: Placenta sent to L&D COMPLICATIONS: None immediate  PROCEDURE IN DETAIL:  The patient received intravenous antibiotics and had sequential compression devices applied to her lower extremities while in the preoperative area.  She was then taken to the operating room where spinal anesthesia was administered and was found to be adequate. She was then placed in a dorsal supine position with a leftward tilt, and prepped and draped in a sterile manner.  A foley catheter was placed into her bladder and attached to constant gravity.  After an adequate timeout was performed, a Pfannenstiel skin incision was made with scalpel and carried through to the underlying layer of fascia.  The fascia was incised in the midline and this incision was extended bilaterally using the Mayo scissors. Kocher clamps were applied to the superior aspect of the fascial incision and the underlying rectus muscles were dissected off bluntly. A similar process was carried out on the inferior aspect of the facial incision. The rectus muscles were separated in the midline bluntly and the peritoneum was entered bluntly. Bladder flap was created sharply and developed bluntly.  Bladder was protected behind the bladder blade.  A transverse hysterotomy was made with a scalpel and extended bilaterally bluntly. The bladder blade was then removed. The infant was successfully delivered, and cord was clamped and cut and infant was handed over to awaiting neonatology team. Uterine massage was then administered and the placenta delivered intact with three-vessel cord. The uterus was cleared of clot and debris.  The hysterotomy was closed with 0 chromic.  A second imbricating suture of 0-chromic was used to reinforce the incision and aid in hemostasis.  The peritoneum and rectus muscles were noted to be hemostatic and were reapproximated using 3-0 monocryl in a running fashion.  The fascia was closed with 0-PDS in a running fashion with good restoration of anatomy.  The subcutaneus tissue was copiously irrigated and was reapproximated using plain gut with 3 interrupted sutures.  The skin was closed with 4-0 vicryl in a subcuticular fashion.  Pt tolerated the procedure will.  All counts were correct x2.  Pt went to the recovery room in stable condition.

## 2015-07-07 NOTE — Anesthesia Preprocedure Evaluation (Addendum)
Anesthesia Evaluation  Patient identified by MRN, date of birth, ID band Patient awake    Reviewed: Allergy & Precautions, NPO status , Patient's Chart, lab work & pertinent test results  History of Anesthesia Complications (+) PONV  Airway Mallampati: II  TM Distance: >3 FB Neck ROM: Full    Dental no notable dental hx. (+) Teeth Intact Upper front veneers:   Pulmonary neg pulmonary ROS,    Pulmonary exam normal breath sounds clear to auscultation       Cardiovascular hypertension, negative cardio ROS Normal cardiovascular exam Rhythm:Regular Rate:Normal     Neuro/Psych negative neurological ROS  negative psych ROS   GI/Hepatic Neg liver ROS, GERD  Medicated and Controlled,  Endo/Other  negative endocrine ROS  Renal/GU negative Renal ROS  negative genitourinary   Musculoskeletal negative musculoskeletal ROS (+)   Abdominal   Peds negative pediatric ROS (+)  Hematology negative hematology ROS (+)   Anesthesia Other Findings   Reproductive/Obstetrics (+) Pregnancy                            Anesthesia Physical Anesthesia Plan  ASA: II  Anesthesia Plan: Spinal   Post-op Pain Management:    Induction:   Airway Management Planned: Natural Airway  Additional Equipment:   Intra-op Plan:   Post-operative Plan: Extubation in OR  Informed Consent: I have reviewed the patients History and Physical, chart, labs and discussed the procedure including the risks, benefits and alternatives for the proposed anesthesia with the patient or authorized representative who has indicated his/her understanding and acceptance.   Dental advisory given  Plan Discussed with: CRNA  Anesthesia Plan Comments:         Anesthesia Quick Evaluation

## 2015-07-07 NOTE — Anesthesia Procedure Notes (Signed)
Spinal Patient location during procedure: OR Staffing Anesthesiologist: Montez Hageman Performed by: anesthesiologist  Preanesthetic Checklist Completed: patient identified, site marked, surgical consent, pre-op evaluation, timeout performed, IV checked, risks and benefits discussed and monitors and equipment checked Spinal Block Patient position: sitting Prep: ChloraPrep Patient monitoring: heart rate, continuous pulse ox and blood pressure Approach: right paramedian Location: L3-4 Injection technique: single-shot Needle Needle type: Pencan  Needle gauge: 25 G Needle length: 9 cm Additional Notes Expiration date of kit checked and confirmed. Patient tolerated procedure well, without complications.

## 2015-07-08 ENCOUNTER — Encounter (HOSPITAL_COMMUNITY): Payer: Self-pay | Admitting: Obstetrics & Gynecology

## 2015-07-08 LAB — CBC
HCT: 31 % — ABNORMAL LOW (ref 36.0–46.0)
Hemoglobin: 10 g/dL — ABNORMAL LOW (ref 12.0–15.0)
MCH: 27.2 pg (ref 26.0–34.0)
MCHC: 32.3 g/dL (ref 30.0–36.0)
MCV: 84.2 fL (ref 78.0–100.0)
PLATELETS: 176 10*3/uL (ref 150–400)
RBC: 3.68 MIL/uL — ABNORMAL LOW (ref 3.87–5.11)
RDW: 14.2 % (ref 11.5–15.5)
WBC: 12.5 10*3/uL — ABNORMAL HIGH (ref 4.0–10.5)

## 2015-07-08 LAB — BIRTH TISSUE RECOVERY COLLECTION (PLACENTA DONATION)

## 2015-07-08 MED ORDER — RHO D IMMUNE GLOBULIN 1500 UNIT/2ML IJ SOSY
300.0000 ug | PREFILLED_SYRINGE | Freq: Once | INTRAMUSCULAR | Status: AC
  Administered 2015-07-08: 300 ug via INTRAVENOUS
  Filled 2015-07-08: qty 2

## 2015-07-08 NOTE — Lactation Note (Signed)
This note was copied from a baby's chart. Lactation Consultation Note  Patient Name: Vicki Rodriguez [id number] Date: 07/08/2015 Reason for consult: Follow-up assessment  Baby 22 hours old. Mom reports that she used a NS with first baby, and although this baby is latching well, she is having some nipple pain. Mom has comfort gels in the room, so enc using in between BF. Mom's bedside nurse, Lanelle Bal, RN is in the room and reports that baby is cluster-feeding. Baby asleep in mom's arms, so enc mom to call her nurse at the next feeding for a latch assessment. Enc mom to support baby's head well at the breast to maintain a deep latch.  Maternal Data    Feeding Feeding Type: Breast Fed Length of feed: 15 min  LATCH Score/Interventions                      Lactation Tools Discussed/Used Tools: Comfort gels   Consult Status Consult Status: Follow-up Follow-up type: In-patient    Inocente Salles 07/08/2015, 11:09 AM

## 2015-07-08 NOTE — Progress Notes (Signed)
Subjective: Postpartum Day 1: Cesarean Delivery Patient reports tolerating PO, + flatus and no problems voiding.    Objective: Vital signs in last 24 hours: Temp:  [97.2 F (36.2 C)-99.1 F (37.3 C)] 99.1 F (37.3 C) (03/03 0421) Pulse Rate:  [44-77] 65 (03/03 0421) Resp:  [14-26] 20 (03/03 0421) BP: (112-136)/(68-84) 120/68 mmHg (03/03 0421) SpO2:  [81 %-100 %] 98 % (03/03 0421)  Physical Exam:  General: alert and cooperative Lochia: appropriate Uterine Fundus: firm Incision: healing well DVT Evaluation: No evidence of DVT seen on physical exam. Negative Homan's sign. No cords or calf tenderness. No significant calf/ankle edema.   Recent Labs  07/06/15 0915 07/08/15 0525  HGB 10.6* 10.0*  HCT 32.5* 31.0*    Assessment/Plan: Status post Cesarean section. Doing well postoperatively.  Continue current care.  CURTIS,CAROL G 07/08/2015, 7:38 AM

## 2015-07-08 NOTE — Progress Notes (Signed)
Incentive spriometer was still in the bag not used. I taught patient how to use it.

## 2015-07-09 LAB — RH IG WORKUP (INCLUDES ABO/RH)
ABO/RH(D): O NEG
Fetal Screen: NEGATIVE
GESTATIONAL AGE(WKS): 39.1
Unit division: 0

## 2015-07-09 NOTE — Progress Notes (Signed)
Subjective: Postpartum Day 2: Cesarean Delivery Patient reports tolerating PO, + flatus and no problems voiding.    Objective: Vital signs in last 24 hours: Temp:  [97.4 F (36.3 C)-98.3 F (36.8 C)] 97.4 F (36.3 C) (03/04 0514) Pulse Rate:  [64-76] 76 (03/04 0514) Resp:  [16-17] 16 (03/04 0514) BP: (123-137)/(79-80) 123/79 mmHg (03/04 0514)  Physical Exam:  General: alert, cooperative and appears stated age Lochia: appropriate Uterine Fundus: firm Incision: healing well, no significant drainage, no dehiscence DVT Evaluation: No evidence of DVT seen on physical exam. Negative Homan's sign. No cords or calf tenderness.   Recent Labs  07/06/15 0915 07/08/15 0525  HGB 10.6* 10.0*  HCT 32.5* 31.0*    Assessment/Plan: Status post Cesarean section. Doing well postoperatively.  Continue current care.  Robbin Escher, [place] 07/09/2015, 9:10 AM

## 2015-07-10 ENCOUNTER — Ambulatory Visit: Payer: Self-pay

## 2015-07-10 LAB — TYPE AND SCREEN
ABO/RH(D): O NEG
Antibody Screen: POSITIVE
DAT, IgG: NEGATIVE
UNIT DIVISION: 0
UNIT DIVISION: 0
Unit division: 0

## 2015-07-10 MED ORDER — IBUPROFEN 600 MG PO TABS: 600.0000 mg | ORAL_TABLET | Freq: Four times a day (QID) | ORAL | Status: DC

## 2015-07-10 MED ORDER — OXYCODONE-ACETAMINOPHEN 5-325 MG PO TABS: 1.0000 | ORAL_TABLET | ORAL | Status: DC | PRN

## 2015-07-10 NOTE — Discharge Instructions (Signed)
Call MD for T>100.4, heavy vaginal bleeding, severe abdominal pain, intractable nausea and/or vomiting, or respiratory distress.  Call office to schedule incision check in 1 week.  No driving while taking narcotics.  Pelvic rest x 6 weeks.   °

## 2015-07-10 NOTE — Lactation Note (Signed)
This note was copied from a baby's chart. Lactation Consultation Note  Patient Name: Vicki Rodriguez [id number] Date: 07/10/2015  mom and baby for discharge today.  Per mom breast feeding is going well both breast and milk seems to be coming in.  Per mom sore nipples have improved.  Sore nipple and engorgement prevention and tx reviewed referring to the Baby and me booklet.  Pages 24 -25.  Mother informed of post-discharge support and given phone number to the lactation department, including  services for phone call assistance; out-patient appointments; and breastfeeding support group. List of other  breastfeeding resources in the community given in the handout. Encouraged mother to call for problems or concerns related to breastfeeding.   Maternal Data    Feeding    [facility] Score/Interventions                      Lactation Tools Discussed/Used     Consult Status      Myer Haff 07/10/2015, 4:39 PM

## 2015-07-10 NOTE — Progress Notes (Signed)
Subjective: Postpartum Day 3: Cesarean Delivery Patient reports tolerating PO, + flatus and no problems voiding.    Objective: Vital signs in last 24 hours: Temp:  [97.7 F (36.5 C)-98.3 F (36.8 C)] 97.7 F (36.5 C) (03/05 0537) Pulse Rate:  [74-79] 79 (03/05 0537) Resp:  [18-20] 20 (03/05 0537) BP: (129-140)/(85-86) 129/86 mmHg (03/05 0537) SpO2:  [100 %] 100 % (03/04 1734)  Physical Exam:  General: alert, cooperative and appears stated age 30: appropriate Uterine Fundus: firm Incision: healing well, no significant drainage, no dehiscence DVT Evaluation: No evidence of DVT seen on physical exam. Negative Homan's sign. No cords or calf tenderness.   Recent Labs  07/08/15 0525  HGB 10.0*  HCT 31.0*    Assessment/Plan: Status post Cesarean section. Doing well postoperatively.  Continue current care. Discharge home. [place] 07/10/2015, 9:00 AM

## 2015-07-10 NOTE — Discharge Summary (Signed)
Obstetric Discharge Summary Reason for Admission: cesarean section Prenatal Procedures: none Intrapartum Procedures: cesarean: low cervical, transverse Postpartum Procedures: Rho(D) Ig Complications-Operative and Postpartum: none HEMOGLOBIN  Date Value Ref Range Status  07/08/2015 10.0* 12.0 - 15.0 g/dL Final   HCT  Date Value Ref Range Status  07/08/2015 31.0* 36.0 - 46.0 % Final    Physical Exam:  General: alert, cooperative and appears stated age 30: appropriate Uterine Fundus: firm Incision: healing well, no significant drainage, no dehiscence DVT Evaluation: No evidence of DVT seen on physical exam. Negative Homan's sign. No cords or calf tenderness.  Discharge Diagnoses: Term Pregnancy-delivered  Discharge Information: Date: 07/10/2015 Activity: pelvic rest Diet: routine Medications: PNV, Ibuprofen and Percocet Condition: stable Instructions: refer to practice specific booklet Discharge to: home   Newborn Data: Live born female  Birth Weight: 6 lb 12.6 oz (3080 g) APGAR: 8, 9  Home with mother.  Vicki Rodriguez, [place] 07/10/2015, 9:04 AM

## 2015-08-15 DIAGNOSIS — Z1389 Encounter for screening for other disorder: Secondary | ICD-10-CM | POA: Diagnosis not present

## 2015-08-22 DIAGNOSIS — Z3202 Encounter for pregnancy test, result negative: Secondary | ICD-10-CM | POA: Diagnosis not present

## 2015-08-22 DIAGNOSIS — Z30017 Encounter for initial prescription of implantable subdermal contraceptive: Secondary | ICD-10-CM | POA: Diagnosis not present

## 2016-03-09 DIAGNOSIS — R6884 Jaw pain: Secondary | ICD-10-CM | POA: Diagnosis not present

## 2016-05-04 ENCOUNTER — Telehealth: Payer: 59 | Admitting: Family

## 2016-05-04 DIAGNOSIS — J019 Acute sinusitis, unspecified: Secondary | ICD-10-CM | POA: Diagnosis not present

## 2016-05-04 DIAGNOSIS — B9689 Other specified bacterial agents as the cause of diseases classified elsewhere: Secondary | ICD-10-CM | POA: Diagnosis not present

## 2016-05-04 MED ORDER — AMOXICILLIN-POT CLAVULANATE 875-125 MG PO TABS: 1.0000 | ORAL_TABLET | Freq: Two times a day (BID) | ORAL | 0 refills | Status: DC

## 2016-05-04 MED ORDER — FLUTICASONE PROPIONATE 50 MCG/ACT NA SUSP: 2.0000 | Freq: Every day | NASAL | 0 refills | Status: DC

## 2016-05-04 NOTE — Progress Notes (Signed)
We are sorry that you are not feeling well.  Here is how we plan to help!  Based on what you have shared with me it looks like you have sinusitis.  Sinusitis is inflammation and infection in the sinus cavities of the head.  Based on your presentation I believe you most likely have Acute Bacterial Sinusitis.  This is an infection caused by bacteria and is treated with antibiotics. I have prescribed Augmentin 875mg /125mg  one tablet twice daily with food, for 7 days. You may use an oral decongestant such as Mucinex D or if you have glaucoma or high blood pressure use plain Mucinex. Saline nasal spray help and can safely be used as often as needed for congestion.  If you develop worsening sinus pain, fever or notice severe headache and vision changes, or if symptoms are not better after completion of antibiotic, please schedule an appointment with a health care provider.    I have also sent in Flonase, a nasal steroid for relief.   Sinus infections are not as easily transmitted as other respiratory infection, however we still recommend that you avoid close contact with loved ones, especially the very young and elderly.  Remember to wash your hands thoroughly throughout the day as this is the number one way to prevent the spread of infection!  Home Care:  Only take medications as instructed by your medical team.  Complete the entire course of an antibiotic.  Do not take these medications with alcohol.  A steam or ultrasonic humidifier can help congestion.  You can place a towel over your head and breathe in the steam from hot water coming from a faucet.  Avoid close contacts especially the very young and the elderly.  Cover your mouth when you cough or sneeze.  Always remember to wash your hands.  Get Help Right Away If:  You develop worsening fever or sinus pain.  You develop a severe head ache or visual changes.  Your symptoms persist after you have completed your treatment plan.  Make  sure you  Understand these instructions.  Will watch your condition.  Will get help right away if you are not doing well or get worse.  Your e-visit answers were reviewed by a board certified advanced clinical practitioner to complete your personal care plan.  Depending on the condition, your plan could have included both over the counter or prescription medications.  If there is a problem please reply  once you have received a response from your provider.  Your safety is important to [place].  If you have drug allergies check your prescription carefully.    You can use MyChart to ask questions about today's visit, request a non-urgent call back, or ask for a work or school excuse for 24 hours related to this e-Visit. If it has been greater than 24 hours you will need to follow up with your provider, or enter a new e-Visit to address those concerns.  You will get an e-mail in the next two days asking about your experience.  I hope that your e-visit has been valuable and will speed your recovery. Thank you for using e-visits.

## 2016-08-16 DIAGNOSIS — Z6823 Body mass index (BMI) 23.0-23.9, adult: Secondary | ICD-10-CM | POA: Diagnosis not present

## 2016-08-16 DIAGNOSIS — Z3009 Encounter for other general counseling and advice on contraception: Secondary | ICD-10-CM | POA: Diagnosis not present

## 2016-08-16 DIAGNOSIS — Z01419 Encounter for gynecological examination (general) (routine) without abnormal findings: Secondary | ICD-10-CM | POA: Diagnosis not present

## 2016-08-27 DIAGNOSIS — Z30430 Encounter for insertion of intrauterine contraceptive device: Secondary | ICD-10-CM | POA: Diagnosis not present

## 2016-08-27 DIAGNOSIS — Z3046 Encounter for surveillance of implantable subdermal contraceptive: Secondary | ICD-10-CM | POA: Diagnosis not present

## 2016-10-08 DIAGNOSIS — Z30431 Encounter for routine checking of intrauterine contraceptive device: Secondary | ICD-10-CM | POA: Diagnosis not present

## 2016-11-23 ENCOUNTER — Inpatient Hospital Stay (HOSPITAL_COMMUNITY): Payer: 59

## 2016-11-23 ENCOUNTER — Inpatient Hospital Stay (HOSPITAL_COMMUNITY)
Admission: AD | Admit: 2016-11-23 | Discharge: 2016-11-23 | Disposition: A | Discharge status: Home / Self Care | Payer: 59 | Source: Ambulatory Visit | Attending: Obstetrics and Gynecology | Admitting: Obstetrics and Gynecology

## 2016-11-23 ENCOUNTER — Encounter (HOSPITAL_COMMUNITY): Payer: Self-pay | Admitting: *Deleted

## 2016-11-23 DIAGNOSIS — Z3202 Encounter for pregnancy test, result negative: Secondary | ICD-10-CM | POA: Diagnosis not present

## 2016-11-23 DIAGNOSIS — N939 Abnormal uterine and vaginal bleeding, unspecified: Secondary | ICD-10-CM | POA: Insufficient documentation

## 2016-11-23 DIAGNOSIS — R1031 Right lower quadrant pain: Secondary | ICD-10-CM | POA: Diagnosis not present

## 2016-11-23 DIAGNOSIS — T8332XA Displacement of intrauterine contraceptive device, initial encounter: Secondary | ICD-10-CM

## 2016-11-23 DIAGNOSIS — I10 Essential (primary) hypertension: Secondary | ICD-10-CM

## 2016-11-23 LAB — URINALYSIS, ROUTINE W REFLEX MICROSCOPIC
BILIRUBIN URINE: NEGATIVE
Glucose, UA: NEGATIVE mg/dL
Hgb urine dipstick: NEGATIVE
KETONES UR: NEGATIVE mg/dL
LEUKOCYTES UA: NEGATIVE
Nitrite: NEGATIVE
PROTEIN: NEGATIVE mg/dL
Specific Gravity, Urine: 1.018 (ref 1.005–1.030)
pH: 5 (ref 5.0–8.0)

## 2016-11-23 LAB — POCT PREGNANCY, URINE: Preg Test, Ur: NEGATIVE

## 2016-11-23 LAB — CBC WITH DIFFERENTIAL/PLATELET
BASOS PCT: 1 %
Basophils Absolute: 0.1 10*3/uL (ref 0.0–0.1)
EOS ABS: 0.3 10*3/uL (ref 0.0–0.7)
EOS PCT: 4 %
HCT: 41.9 % (ref 36.0–46.0)
Hemoglobin: 14.4 g/dL (ref 12.0–15.0)
LYMPHS ABS: 2.6 10*3/uL (ref 0.7–4.0)
Lymphocytes Relative: 31 %
MCH: 28.9 pg (ref 26.0–34.0)
MCHC: 34.4 g/dL (ref 30.0–36.0)
MCV: 84.1 fL (ref 78.0–100.0)
MONO ABS: 0.6 10*3/uL (ref 0.1–1.0)
Monocytes Relative: 8 %
NEUTROS PCT: 56 %
Neutro Abs: 4.7 10*3/uL (ref 1.7–7.7)
PLATELETS: 245 10*3/uL (ref 150–400)
RBC: 4.98 MIL/uL (ref 3.87–5.11)
RDW: 13 % (ref 11.5–15.5)
WBC: 8.3 10*3/uL (ref 4.0–10.5)

## 2016-11-23 MED ORDER — HYDROCHLOROTHIAZIDE 25 MG PO TABS: 25.0000 mg | ORAL_TABLET | Freq: Every day | ORAL | 0 refills | Status: DC

## 2016-11-23 MED ORDER — HYDROCHLOROTHIAZIDE 25 MG PO TABS
50.0000 mg | ORAL_TABLET | Freq: Every day | ORAL | Status: DC
  Administered 2016-11-23: 50 mg via ORAL
  Filled 2016-11-23 (×2): qty 2

## 2016-11-23 NOTE — Discharge Instructions (Signed)
Hypertension Hypertension, commonly called high blood pressure, is when the force of blood pumping through the arteries is too strong. The arteries are the blood vessels that carry blood from the heart throughout the body. Hypertension forces the heart to work harder to pump blood and may cause arteries to become narrow or stiff. Having untreated or uncontrolled hypertension can cause heart attacks, strokes, kidney disease, and other problems. A blood pressure reading consists of a higher number over a lower number. Ideally, your blood pressure should be below 120/80. The first ("top") number is called the systolic pressure. It is a measure of the pressure in your arteries as your heart beats. The second ("bottom") number is called the diastolic pressure. It is a measure of the pressure in your arteries as the heart relaxes. What are the causes? The cause of this condition is not known. What increases the risk? Some risk factors for high blood pressure are under your control. Others are not. Factors you can change  Smoking.  Having type 2 diabetes mellitus, high cholesterol, or both.  Not getting enough exercise or physical activity.  Being overweight.  Having too much fat, sugar, calories, or salt (sodium) in your diet.  Drinking too much alcohol. Factors that are difficult or impossible to change  Having chronic kidney disease.  Having a family history of high blood pressure.  Age. Risk increases with age.  Race. You may be at higher risk if you are African-American.  Gender. Men are at higher risk than women before age 59. After age 32, women are at higher risk than men.  Having obstructive sleep apnea.  Stress. What are the signs or symptoms? Extremely high blood pressure (hypertensive crisis) may cause:  Headache.  Anxiety.  Shortness of breath.  Nosebleed.  Nausea and vomiting.  Severe chest pain.  Jerky movements you cannot control (seizures).  How is this  diagnosed? This condition is diagnosed by measuring your blood pressure while you are seated, with your arm resting on a surface. The cuff of the blood pressure monitor will be placed directly against the skin of your upper arm at the level of your heart. It should be measured at least twice using the same arm. Certain conditions can cause a difference in blood pressure between your right and left arms. Certain factors can cause blood pressure readings to be lower or higher than normal (elevated) for a short period of time:  When your blood pressure is higher when you are in a health care provider's office than when you are at home, this is called white coat hypertension. Most people with this condition do not need medicines.  When your blood pressure is higher at home than when you are in a health care provider's office, this is called masked hypertension. Most people with this condition may need medicines to control blood pressure.  If you have a high blood pressure reading during one visit or you have normal blood pressure with other risk factors:  You may be asked to return on a different day to have your blood pressure checked again.  You may be asked to monitor your blood pressure at home for 1 week or longer.  If you are diagnosed with hypertension, you may have other blood or imaging tests to help your health care provider understand your overall risk for other conditions. How is this treated? This condition is treated by making healthy lifestyle changes, such as eating healthy foods, exercising more, and reducing your alcohol intake. Your  health care provider may prescribe medicine if lifestyle changes are not enough to get your blood pressure under control, and if:  Your systolic blood pressure is above 130.  Your diastolic blood pressure is above 80.  Your personal target blood pressure may vary depending on your medical conditions, your age, and other factors. Follow these  instructions at home: Eating and drinking  Eat a diet that is high in fiber and potassium, and low in sodium, added sugar, and fat. An example eating plan is called the DASH (Dietary Approaches to Stop Hypertension) diet. To eat this way: ? Eat plenty of fresh fruits and vegetables. Try to fill half of your plate at each meal with fruits and vegetables. ? Eat whole grains, such as whole wheat pasta, brown rice, or whole grain bread. Fill about one quarter of your plate with whole grains. ? Eat or drink low-fat dairy products, such as skim milk or low-fat yogurt. ? Avoid fatty cuts of meat, processed or cured meats, and poultry with skin. Fill about one quarter of your plate with lean proteins, such as fish, chicken without skin, beans, eggs, and tofu. ? Avoid premade and processed foods. These tend to be higher in sodium, added sugar, and fat.  Reduce your daily sodium intake. Most people with hypertension should eat less than 1,500 mg of sodium a day.  Limit alcohol intake to no more than 1 drink a day for nonpregnant women and 2 drinks a day for men. One drink equals 12 oz of beer, 5 oz of wine, or 1 oz of hard liquor. Lifestyle  Work with your health care provider to maintain a healthy body weight or to lose weight. Ask what an ideal weight is for you.  Get at least 30 minutes of exercise that causes your heart to beat faster (aerobic exercise) most days of the week. Activities may include walking, swimming, or biking.  Include exercise to strengthen your muscles (resistance exercise), such as pilates or lifting weights, as part of your weekly exercise routine. Try to do these types of exercises for 30 minutes at least 3 days a week.  Do not use any products that contain nicotine or tobacco, such as cigarettes and e-cigarettes. If you need help quitting, ask your health care provider.  Monitor your blood pressure at home as told by your health care provider.  Keep all follow-up visits as  told by your health care provider. This is important. Medicines  Take over-the-counter and prescription medicines only as told by your health care provider. Follow directions carefully. Blood pressure medicines must be taken as prescribed.  Do not skip doses of blood pressure medicine. Doing this puts you at risk for problems and can make the medicine less effective.  Ask your health care provider about side effects or reactions to medicines that you should watch for. Contact a health care provider if:  You think you are having a reaction to a medicine you are taking.  You have headaches that keep coming back (recurring).  You feel dizzy.  You have swelling in your ankles.  You have trouble with your vision. Get help right away if:  You develop a severe headache or confusion.  You have unusual weakness or numbness.  You feel faint.  You have severe pain in your chest or abdomen.  You vomit repeatedly.  You have trouble breathing. Summary  Hypertension is when the force of blood pumping through your arteries is too strong. If this condition is not  controlled, it may put you at risk for serious complications.  Your personal target blood pressure may vary depending on your medical conditions, your age, and other factors. For most people, a normal blood pressure is less than 120/80.  Hypertension is treated with lifestyle changes, medicines, or a combination of both. Lifestyle changes include weight loss, eating a healthy, low-sodium diet, exercising more, and limiting alcohol. This information is not intended to replace advice given to you by your health care provider. Make sure you discuss any questions you have with your health care provider. Document Released: 04/23/2005 Document Revised: 03/21/2016 Document Reviewed: 03/21/2016 Elsevier Interactive Patient Education  2018 [employer].     Contraception Choices Contraception, also called birth control, means things to  use or ways to try not to get pregnant. Hormonal birth control This kind of birth control uses hormones. Here are some types of hormonal birth control:  A tube that is put under skin of the arm (implant). The tube can stay in for as long as 3 years.  Shots to get every 3 months (injections).  Pills to take every day (birth control pills).  A patch to change 1 time each week for 3 weeks (birth control patch). After that, the patch is taken off for 1 week.  A ring to put in the vagina. The ring is left in for 3 weeks. Then it is taken out of the vagina for 1 week. Then a new ring is put in.  Pills to take after unprotected sex (emergency birth control pills).  Barrier birth control Here are some types of barrier birth control:  A thin covering that is put on the penis before sex (female condom). The covering is thrown away after sex.  A soft, loose covering that is put in the vagina before sex (female condom). The covering is thrown away after sex.  A rubber bowl that sits over the cervix (diaphragm). The bowl must be made for you. The bowl is put into the vagina before sex. The bowl is left in for 6-8 hours after sex. It is taken out within 24 hours.  A small, soft cup that fits over the cervix (cervical cap). The cup must be made for you. The cup can be left in for 6-8 hours after sex. It is taken out within 48 hours.  A sponge that is put into the vagina before sex. It must be left in for at least 6 hours after sex. It must be taken out within 30 hours. Then it is thrown away.  A chemical that kills or stops sperm from getting into the uterus (spermicide). It may be a pill, cream, jelly, or foam to put in the vagina. The chemical should be used at least 10-15 minutes before sex.  IUD (intrauterine) birth control An IUD is a small, T-shaped piece of plastic. It is put inside the uterus. There are two kinds:  Hormone IUD. This kind can stay in for 3-5 years.  Copper IUD. This kind  can stay in for 10 years.  Permanent birth control Here are some types of permanent birth control:  Surgery to block the fallopian tubes.  Having an insert put into each fallopian tube.  Surgery to tie off the tubes that carry sperm (vasectomy).  Natural planning birth control Here are some types of natural planning birth control:  Not having sex on the days the woman could get pregnant.  Using a calendar: ? To keep track of the length of each  period. ? To find out what days pregnancy can happen. ? To plan to not have sex on days when pregnancy can happen.  Watching for symptoms of ovulation and not having sex during ovulation. One way the woman can check for ovulation is to check her temperature.  Waiting to have sex until after ovulation.  Summary  Contraception, also called birth control, means things to use or ways to try not to get pregnant.  Hormonal methods of birth control include implants, injections, pills, patches, vaginal rings, and emergency birth control pills.  Barrier methods of birth control can include female condoms, female condoms, diaphragms, cervical caps, sponges, and spermicides.  There are two types of IUD (intrauterine device) birth control. An IUD can be put in a woman's uterus to prevent pregnancy for 3-5 years.  Permanent sterilization can be done through a procedure for males, females, or both.  Natural planning methods involve not having sex on the days when the woman could get pregnant. This information is not intended to replace advice given to you by your health care provider. Make sure you discuss any questions you have with your health care provider. Document Released: 02/18/2009 Document Revised: 05/03/2016 Document Reviewed: 05/03/2016 Elsevier Interactive Patient Education  2017 [employer].

## 2016-11-23 NOTE — MAU Note (Signed)
Patient has Vicki Rodriguez IUD about 12 weeks,  Had a month of BCP finished 2 weeks ago, having heavy vaginal bleeding x 4 hours changing a tampon every 45 minutes. No cramping.

## 2016-11-23 NOTE — MAU Note (Signed)
Delivered in March 2017 and was put on the Explanon in her arm but had a lot of bleedin; switched to the [place] about 13 weeks ago but still had bleeding and then the OB added BC pills about 7weeks ago; pt finished BC about 2 weeks ago; pt is still having intermittent heavy bleeding; bleeding today heavy and changing tampons every 45 minutes;

## 2016-11-23 NOTE — MAU Provider Note (Signed)
History     CSN: [id number]  Arrival date and time: 11/23/16 1621   None     Chief Complaint  Patient presents with  . Vaginal Bleeding   HPI  Vicki Rodriguez is 31 y.o. Delightful young woman, [id number]  presenting with report of heavy vagijnal bleeding X 4 hrs changing tampon q45 minutes.  Hx of IUD placement 12 weeks ago.because she had persistent bleeding with Nexplanon that was placed after delivery 3/17.  Was given 1 pack of OCPs 6 weeks ago (completed 2 weeks ago).  Bleeding improved after withdrawal bleeding that followed OCPs.  2 days bleeding return.  Denies pain.  Called the office today, another Rx for OCPs were sent in today.  Told to come here for evaluation.  She is patient of Dr. Lynnette Caffey. BP elevated in Triage.  States hx of hypertension in college but normal BP readings since.    Past Medical History:  Diagnosis Date  . GERD (gastroesophageal reflux disease)    pregnancy related  . 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 PONV (postoperative nausea and vomiting)   . Seasonal allergies     Past Surgical History:  Procedure Laterality Date  . CESAREAN SECTION  2014   x 1  . CESAREAN SECTION WITH BILATERAL TUBAL LIGATION Bilateral 07/07/2015    No family history on file.  Social History  Substance Use Topics  . Smoking status: Never Smoker  . Smokeless tobacco: Never Used  . Alcohol use No    Allergies: No Known Allergies  No prescriptions prior to admission.    Review of Systems  Constitutional: Negative for chills and fever.  Respiratory: Negative for chest tightness.   Cardiovascular: Negative for chest pain.  Gastrointestinal: Negative for abdominal pain.  Genitourinary: Positive for vaginal bleeding. Negative for pelvic pain.   Physical Exam   Blood pressure (!) 189/121, pulse 83, temperature 98.2 F (36.8 C), temperature source Oral, resp. rate 16, height 5\' 4"  (1.626 m), weight 136 lb 1.9 oz (61.7 kg), last menstrual period 11/23/2016, unknown  if currently breastfeeding.  Physical Exam  Nursing note and vitals reviewed. Constitutional: She is oriented to person, place, and time. She appears well-developed and well-nourished. No distress.  HENT:  Head: Normocephalic.  Neck: Normal range of motion.  Cardiovascular: Normal rate.   Respiratory: Effort normal.  GI: Soft. There is no tenderness.  Genitourinary: There is no rash, tenderness or lesion on the right labia. There is no rash, tenderness or lesion on the left labia. Cervix exhibits no motion tenderness and no discharge. There is bleeding (small amount of bright red vaginal bleeding.) in the vagina. No erythema or tenderness in the vagina. No vaginal discharge found.  Genitourinary Comments: IUD string is longer than expected.  I think I see the device on the left edge of the cervix.  Bleeding is making it difficult to be sure. Suspicious of the device being expelled.   Neurological: She is alert and oriented to person, place, and time.  Skin: Skin is warm and dry.  Psychiatric: She has a normal mood and affect. Her behavior is normal.   Results for orders placed or performed during the hospital encounter of 11/23/16 (from the past 24 hour(s))  Urinalysis, Routine w reflex microscopic     Status: None   Collection Time: 11/23/16  4:30 PM  Result Value Ref Range   Color, Urine YELLOW YELLOW   APPearance CLEAR CLEAR   Specific Gravity, Urine 1.018 1.005 - 1.030  pH 5.0 5.0 - 8.0   Glucose, UA NEGATIVE NEGATIVE mg/dL   Hgb urine dipstick NEGATIVE NEGATIVE   Bilirubin Urine NEGATIVE NEGATIVE   Ketones, ur NEGATIVE NEGATIVE mg/dL   Protein, ur NEGATIVE NEGATIVE mg/dL   Nitrite NEGATIVE NEGATIVE   Leukocytes, UA NEGATIVE NEGATIVE  Pregnancy, urine POC     Status: None   Collection Time: 11/23/16  4:34 PM  Result Value Ref Range   Preg Test, Ur NEGATIVE NEGATIVE   MAU Course  Procedures  MDM MSE Call to Dr. Helane Rima to report MSE, history of bleeding and elevated BP.   Orders given for exam, U/S for IUD placement and CBC.  If all WNL may discharge to home with instructions to take OCPs as directed that were called in today.  Discussed suspicion of IUD device being expelled, U/S confirmation Discussed we cannot give OCPs with her BP elevated at this reading. PO hydration Care turned over to E. Purcell Nails, NP at 17:45 Assessment and Plan  A:  Vaginal bleeding  Vicki Rodriguez 11/23/2016, 5:11 PM

## 2016-11-27 DIAGNOSIS — I10 Essential (primary) hypertension: Secondary | ICD-10-CM | POA: Diagnosis not present

## 2016-11-27 DIAGNOSIS — Z3009 Encounter for other general counseling and advice on contraception: Secondary | ICD-10-CM | POA: Diagnosis not present

## 2016-12-03 DIAGNOSIS — I10 Essential (primary) hypertension: Secondary | ICD-10-CM | POA: Diagnosis not present

## 2016-12-17 DIAGNOSIS — D225 Melanocytic nevi of trunk: Secondary | ICD-10-CM | POA: Diagnosis not present

## 2016-12-17 DIAGNOSIS — L858 Other specified epidermal thickening: Secondary | ICD-10-CM | POA: Diagnosis not present

## 2016-12-21 ENCOUNTER — Other Ambulatory Visit: Payer: Self-pay | Admitting: Student

## 2016-12-21 MED FILL — NORETHINDRONE 0.35 MG TAB: 0.35 | 28 days supply | Qty: 28 | Fill #0

## 2016-12-21 MED FILL — NIFEDIPINE ER 30 MG TABLET: 30 | 30 days supply | Qty: 60 | Fill #0

## 2017-01-15 MED FILL — NORETHINDRONE 0.35 MG TAB: 0.35 | 28 days supply | Qty: 28 | Fill #1

## 2017-02-13 MED FILL — NORETHINDRONE 0.35 MG TAB: 0.35 | 28 days supply | Qty: 28 | Fill #2

## 2017-02-13 MED FILL — NIFEDIPINE ER 30 MG TABLET: 30 | 30 days supply | Qty: 60 | Fill #1

## 2017-03-13 ENCOUNTER — Telehealth: Payer: 59 | Admitting: Family

## 2017-03-13 DIAGNOSIS — J019 Acute sinusitis, unspecified: Secondary | ICD-10-CM

## 2017-03-13 MED ORDER — AMOXICILLIN-POT CLAVULANATE 875-125 MG PO TABS: 1.0000 | ORAL_TABLET | Freq: Two times a day (BID) | ORAL | 0 refills | Status: DC

## 2017-03-13 NOTE — Progress Notes (Signed)

## 2017-03-25 NOTE — Progress Notes (Signed)
HPI:  Vicki Rodriguez is here to establish care.  Last PCP and physical: Sees Megan Morris in gynecology for yearly exams  Has the following chronic problems that require follow up and concerns today:  Hypertension: -Started after an IUD insertion, and earlier this year -She has been seeing GYN for this, reports she had a number of labs done for workup and these were normal -On nifedipine, in case of conception, tolerating well -Husband recently had vasectomy -Strong family history of hypertension on father's side of the family -No tobacco, minimal alcohol use -Denies: Chest pain, shortness of breath, swelling, headaches or palpitations   ROS negative for unless reported above: fevers, unintentional weight loss, hearing or vision loss, chest pain, palpitations, struggling to breath, hemoptysis, melena, hematochezia, hematuria, falls, loc, si, thoughts of self harm  Past Medical History:  Diagnosis Date  . GERD (gastroesophageal reflux disease)    pregnancy related  . 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 PONV (postoperative nausea and vomiting)   . Seasonal allergies     Past Surgical History:  Procedure Laterality Date  . CESAREAN SECTION  2014   x 1  . REPEAT CESAREAN SECTION Bilateral 07/07/2015   Performed by Linda Hedges, DO at [facility] ORS    Family History  Problem Relation Age of Onset  . Hypertension Father     Social History   Socioeconomic History  . Marital status: Married    Spouse name: None  . Number of children: None  . Years of education: None  . Highest education level: None  Social Needs  . Financial resource strain: None  . Food insecurity - worry: None  . Food insecurity - inability: None  . Transportation needs - medical: None  . Transportation needs - non-medical: None  Occupational History  . None  Tobacco Use  . Smoking status: Never Smoker  . Smokeless tobacco: Never Used  Substance and Sexual Activity  . Alcohol use: No  . Drug  use: No  . Sexual activity: Yes  Other Topics Concern  . None  Social History Narrative   Work or School:      Home Situation:      Spiritual Beliefs:      Lifestyle:     Current Outpatient Medications:  Marland Kitchen  Multiple Vitamin (MULTIVITAMIN WITH MINERALS) TABS tablet, Take 1 tablet by mouth daily., Disp: , Rfl:  .  NIFEdipine (PROCARDIA-XL/ADALAT-CC/NIFEDICAL-XL) 30 MG 24 hr tablet, Take 30 mg 2 (two) times daily by mouth. , Disp: , Rfl: 2  EXAM:  Vitals:   03/26/17 0850  BP: 118/82  Pulse: 87  Temp: 98.3 F (36.8 C)    Body mass index is 22.88 kg/m.  GENERAL: vitals reviewed and listed above, alert, oriented, appears well hydrated and in no acute distress  HEENT: atraumatic, conjunttiva clear, no obvious abnormalities on inspection of external nose and ears  NECK: no obvious masses on inspection  LUNGS: clear to auscultation bilaterally, no wheezes, rales or rhonchi, good air movement  CV: HRRR, no peripheral edema  MS: moves all extremities without noticeable abnormality  PSYCH: pleasant and cooperative, no obvious depression or anxiety  ASSESSMENT AND PLAN:  Discussed the following assessment and plan:  Essential hypertension - Plan: Basic metabolic panel, CBC, Hemoglobin A1c, Lipid panel -Labs per orders -Continue nifedipine for 1 year, then may switch to first-line medication -Lifestyle recommendations -Follow-up 3-4 months  Encounter to establish care with new doctor -We reviewed the PMH, PSH, FH, SH,  Meds and Allergies. -We provided refills for any medications we will prescribe as needed.    -Patient advised to return or notify a doctor immediately if symptoms worsen or persist or new concerns arise.  Patient Instructions  BEFORE YOU LEAVE: -labs -follow up: 4 months  We have ordered labs or studies at this visit. It can take up to 1-2 weeks for results and processing. IF results require follow up or explanation, we will call you with  instructions. Clinically stable results will be released to your [facility]. If you have not heard from [place] or cannot find your results in [facility] in 2 weeks please contact our office at [phone].  If you are not yet signed up for [facility], please consider signing up.   We recommend the following healthy lifestyle for LIFE: 1) Small portions. But, make sure to get regular (at least 3 per day), healthy meals and small healthy snacks if needed.  2) Eat a healthy clean diet.   TRY TO EAT: -at least 5-7 servings of low sugar, colorful, and nutrient rich vegetables per day (not corn, potatoes or bananas.) -berries are the best choice if you wish to eat fruit (only eat small amounts if trying to reduce weight)  -lean meets (fish, white meat of chicken or [place]) -vegan proteins for some meals - beans or tofu, whole grains, nuts and seeds -Replace bad fats with good fats - good fats include: fish, nuts and seeds, canola oil, olive oil -small amounts of low fat or non fat dairy -small amounts of100 % whole grains - check the lables -drink plenty of water  AVOID: -SUGAR, sweets, anything with added sugar, corn syrup or sweeteners - must read labels as even foods advertised as "healthy" often are loaded with sugar -if you must have a sweetener, small amounts of stevia may be best -sweetened beverages and artificially sweetened beverages -simple starches (rice, bread, potatoes, pasta, chips, etc - small amounts of 100% whole grains are ok) -red meat, pork, butter -fried foods, fast food, processed food, excessive dairy, eggs and coconut.  3)Get at least 150 minutes of sweaty aerobic exercise per week.  4)Reduce stress - consider counseling, meditation and relaxation to balance other aspects of your life.  WE NOW OFFER   [facility] FAST TRACK!!!  SAME DAY Appointments for ACUTE CARE  Such as: Sprains, Injuries, cuts, abrasions, rashes, muscle pain, joint pain, back pain Colds,  flu, sore throats, headache, allergies, cough, fever  Ear pain, sinus and eye infections Abdominal pain, nausea, vomiting, diarrhea, upset stomach Animal/insect bites  3 Easy Ways to Schedule: Walk-In Scheduling Call in scheduling Mychart Sign-up: [email or web address]            Colin Benton R.

## 2017-03-26 ENCOUNTER — Ambulatory Visit (INDEPENDENT_AMBULATORY_CARE_PROVIDER_SITE_OTHER): Payer: 59 | Admitting: Family Medicine

## 2017-03-26 ENCOUNTER — Encounter: Payer: Self-pay | Admitting: Family Medicine

## 2017-03-26 VITALS — BP 118/82 | HR 87 | Temp 98.3°F | Ht 64.5 in | Wt 135.4 lb

## 2017-03-26 DIAGNOSIS — I10 Essential (primary) hypertension: Secondary | ICD-10-CM | POA: Diagnosis not present

## 2017-03-26 DIAGNOSIS — Z7689 Persons encountering health services in other specified circumstances: Secondary | ICD-10-CM

## 2017-03-26 LAB — CBC
HEMATOCRIT: 41.8 % (ref 36.0–46.0)
Hemoglobin: 14.3 g/dL (ref 12.0–15.0)
MCHC: 34.2 g/dL (ref 30.0–36.0)
MCV: 86.5 fl (ref 78.0–100.0)
PLATELETS: 225 10*3/uL (ref 150.0–400.0)
RBC: 4.83 Mil/uL (ref 3.87–5.11)
RDW: 12.9 % (ref 11.5–15.5)
WBC: 6.8 10*3/uL (ref 4.0–10.5)

## 2017-03-26 LAB — BASIC METABOLIC PANEL
BUN: 17 mg/dL (ref 6–23)
CHLORIDE: 105 meq/L (ref 96–112)
CO2: 28 meq/L (ref 19–32)
CREATININE: 0.94 mg/dL (ref 0.40–1.20)
Calcium: 9.4 mg/dL (ref 8.4–10.5)
GFR: 73.37 mL/min (ref >60.00)
Glucose, Bld: 95 mg/dL (ref 70–99)
POTASSIUM: 4.2 meq/L (ref 3.5–5.1)
Sodium: 139 mEq/L (ref 135–145)

## 2017-03-26 LAB — LIPID PANEL
Cholesterol: 149 mg/dL (ref 0–200)
HDL: 42.3 mg/dL (ref >39.00)
LDL CALC: 97 mg/dL (ref 0–99)
NONHDL: 106.39
Total CHOL/HDL Ratio: 4
Triglycerides: 45 mg/dL (ref 0.0–149.0)
VLDL: 9 mg/dL (ref 0.0–40.0)

## 2017-03-26 LAB — HEMOGLOBIN A1C: HEMOGLOBIN A1C: 5 % (ref 4.6–6.5)

## 2017-03-26 NOTE — Patient Instructions (Signed)
BEFORE YOU LEAVE: -labs -follow up: 4 months  We have ordered labs or studies at this visit. It can take up to 1-2 weeks for results and processing. IF results require follow up or explanation, we will call you with instructions. Clinically stable results will be released to your [facility]. If you have not heard from [place] or cannot find your results in [facility] in 2 weeks please contact our office at [phone].  If you are not yet signed up for [facility], please consider signing up.   We recommend the following healthy lifestyle for LIFE: 1) Small portions. But, make sure to get regular (at least 3 per day), healthy meals and small healthy snacks if needed.  2) Eat a healthy clean diet.   TRY TO EAT: -at least 5-7 servings of low sugar, colorful, and nutrient rich vegetables per day (not corn, potatoes or bananas.) -berries are the best choice if you wish to eat fruit (only eat small amounts if trying to reduce weight)  -lean meets (fish, white meat of chicken or [place]) -vegan proteins for some meals - beans or tofu, whole grains, nuts and seeds -Replace bad fats with good fats - good fats include: fish, nuts and seeds, canola oil, olive oil -small amounts of low fat or non fat dairy -small amounts of100 % whole grains - check the lables -drink plenty of water  AVOID: -SUGAR, sweets, anything with added sugar, corn syrup or sweeteners - must read labels as even foods advertised as "healthy" often are loaded with sugar -if you must have a sweetener, small amounts of stevia may be best -sweetened beverages and artificially sweetened beverages -simple starches (rice, bread, potatoes, pasta, chips, etc - small amounts of 100% whole grains are ok) -red meat, pork, butter -fried foods, fast food, processed food, excessive dairy, eggs and coconut.  3)Get at least 150 minutes of sweaty aerobic exercise per week.  4)Reduce stress - consider counseling, meditation and relaxation to balance other  aspects of your life.  WE NOW OFFER   [facility] FAST TRACK!!!  SAME DAY Appointments for ACUTE CARE  Such as: Sprains, Injuries, cuts, abrasions, rashes, muscle pain, joint pain, back pain Colds, flu, sore throats, headache, allergies, cough, fever  Ear pain, sinus and eye infections Abdominal pain, nausea, vomiting, diarrhea, upset stomach Animal/insect bites  3 Easy Ways to Schedule: Walk-In Scheduling Call in scheduling Mychart Sign-up: [email or web address]

## 2017-04-23 MED FILL — NIFEDIPINE ER 30 MG TABLET: 30 | 30 days supply | Qty: 60 | Fill #2

## 2017-05-12 ENCOUNTER — Telehealth: Payer: No Typology Code available for payment source | Admitting: Family

## 2017-05-12 DIAGNOSIS — J069 Acute upper respiratory infection, unspecified: Secondary | ICD-10-CM

## 2017-05-12 MED ORDER — BENZONATATE 100 MG PO CAPS: 100.0000 mg | ORAL_CAPSULE | Freq: Three times a day (TID) | ORAL | 0 refills | Status: DC | PRN

## 2017-05-12 MED ORDER — FLUTICASONE PROPIONATE 50 MCG/ACT NA SUSP: 2.0000 | Freq: Every day | NASAL | 6 refills | Status: DC

## 2017-05-12 NOTE — Progress Notes (Signed)

## 2017-05-13 ENCOUNTER — Ambulatory Visit (INDEPENDENT_AMBULATORY_CARE_PROVIDER_SITE_OTHER): Payer: No Typology Code available for payment source | Admitting: Family Medicine

## 2017-05-13 ENCOUNTER — Ambulatory Visit: Payer: Self-pay | Admitting: Family Medicine

## 2017-05-13 ENCOUNTER — Encounter: Payer: Self-pay | Admitting: Family Medicine

## 2017-05-13 VITALS — BP 128/82 | HR 91 | Temp 98.2°F | Ht 64.5 in | Wt 136.1 lb

## 2017-05-13 DIAGNOSIS — J069 Acute upper respiratory infection, unspecified: Secondary | ICD-10-CM | POA: Diagnosis not present

## 2017-05-13 LAB — POC INFLUENZA A&B (BINAX/QUICKVUE)
INFLUENZA A, POC: NEGATIVE
Influenza B, POC: NEGATIVE

## 2017-05-13 NOTE — Addendum Note (Signed)
Addended by: Agnes Lawrence on: 05/13/2017 02:23 PM   Modules accepted: Orders

## 2017-05-13 NOTE — Patient Instructions (Addendum)
BEFORE YOU LEAVE: -rapid flu test  INSTRUCTIONS FOR UPPER RESPIRATORY INFECTION:  -plenty of rest and fluids  -nasal saline wash 2-3 times daily (use prepackaged nasal saline or bottled/distilled water if making your own)   -can use AFRIN nasal spray for drainage and nasal congestion - but do NOT use longer then 3-4 days  -can use tylenol (in no history of liver disease) or ibuprofen (if no history of kidney disease, bowel bleeding or significant heart disease) as directed for aches and sorethroat  -in the winter time, using a humidifier at night is helpful (please follow cleaning instructions)  -if you are taking a cough medication - use only as directed, may also try a teaspoon of honey to coat the throat and throat lozenges. If given a cough medication with codeine or hydrocodone or other narcotic please be advised that this contains a strong and  potentially addicting medication. Please follow instructions carefully, take as little as possible and only use AS NEEDED for severe cough. Discuss potential side effects with your pharmacy. Please do not drive or operate machinery while taking these types of medications. Please do not take other sedating medications, drugs or alcohol while taking this medication without discussing with your doctor.  -for sore throat, salt water gargles can help  -follow up if you have fevers, facial pain, tooth pain, difficulty breathing or are worsening or symptoms persist longer then expected  Upper Respiratory Infection, Adult An upper respiratory infection (URI) is also known as the common cold. It is often caused by a type of germ (virus). Colds are easily spread (contagious). You can pass it to others by kissing, coughing, sneezing, or drinking out of the same glass. Usually, you get better in 1 to 3  weeks.  However, the cough can last for even longer. HOME CARE   Only take medicine as told by your doctor. Follow instructions provided above.  Drink  enough water and fluids to keep your pee (urine) clear or pale yellow.  Get plenty of rest.  Return to work when your temperature is < 100 for 24 hours or as told by your doctor. You may use a face mask and wash your hands to stop your cold from spreading. GET HELP RIGHT AWAY IF:   After the first few days, you feel you are getting worse.  You have questions about your medicine.  You have chills, shortness of breath, or red spit (mucus).  You have pain in the face for more then 1-2 days, especially when you bend forward.  You have a fever, puffy (swollen) neck, pain when you swallow, or white spots in the back of your throat.  You have a bad headache, ear pain, sinus pain, or chest pain.  You have a high-pitched whistling sound when you breathe in and out (wheezing).  You cough up blood.  You have sore muscles or a stiff neck. MAKE SURE YOU:   Understand these instructions.  Will watch your condition.  Will get help right away if you are not doing well or get worse. Document Released: 10/10/2007 Document Revised: 07/16/2011 Document Reviewed: 07/29/2013 [facility] Patient Information 2015 [place], [place]. This information is not intended to replace advice given to you by your health care provider. Make sure you discuss any questions you have with your health care provider.

## 2017-05-13 NOTE — Progress Notes (Signed)
HPI:  Acute visit for respiratory illness: -started:about 4 days ago -symptoms:nasal congestion, sore throat, cough, low grade fevers -denies: SOB, NVD, tooth pain -has tried: otc combo cold medication but it elevated her BP -sick contacts/travel/risks: no reported flu, strep or tick exposure - kids all sick with various upper resp in GI issues - they are going to their pediatrician today  ROS: See pertinent positives and negatives per HPI.  Past Medical History:  Diagnosis Date  . GERD (gastroesophageal reflux disease)    pregnancy related  . 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 PONV (postoperative nausea and vomiting)   . Seasonal allergies     Past Surgical History:  Procedure Laterality Date  . CESAREAN SECTION  2014   x 1  . CESAREAN SECTION WITH BILATERAL TUBAL LIGATION Bilateral 07/07/2015    Family History  Problem Relation Age of Onset  . Hypertension Father     Social History   Socioeconomic History  . Marital status: Married    Spouse name: None  . Number of children: None  . Years of education: None  . Highest education level: None  Social Needs  . Financial resource strain: None  . Food insecurity - worry: None  . Food insecurity - inability: None  . Transportation needs - medical: None  . Transportation needs - non-medical: None  Occupational History  . None  Tobacco Use  . Smoking status: Never Smoker  . Smokeless tobacco: Never Used  Substance and Sexual Activity  . Alcohol use: No  . Drug use: No  . Sexual activity: Yes  Other Topics Concern  . None  Social History Narrative   Work or School: Marine scientist, Therapist, music at [employer], full-time, night shifts      Home Situation: Lives with husband and 2 daughters, age 33 and 36 months in November/2018 in November/2018      Spiritual Beliefs: Christian      Lifestyle: No regular exercise, healthy diet     Current Outpatient Medications:  .  benzonatate (TESSALON PERLES) 100 MG capsule, Take 1 capsule (100 mg  total) by mouth 3 (three) times daily as needed., Disp: 20 capsule, Rfl: 0 .  fluticasone (FLONASE) 50 MCG/ACT nasal spray, Place 2 sprays into both nostrils daily., Disp: 16 g, Rfl: 6 .  Multiple Vitamin (MULTIVITAMIN WITH MINERALS) TABS tablet, Take 1 tablet by mouth daily., Disp: , Rfl:  .  NIFEdipine (PROCARDIA-XL/ADALAT-CC/NIFEDICAL-XL) 30 MG 24 hr tablet, Take 30 mg 2 (two) times daily by mouth. , Disp: , Rfl: 2  EXAM:  Vitals:   05/13/17 1354  BP: 128/82  Pulse: 91  Temp: 98.2 F (36.8 C)  SpO2: 99%    Body mass index is 23 kg/m.  GENERAL: vitals reviewed and listed above, alert, oriented, appears well hydrated and in no acute distress  HEENT: atraumatic, conjunttiva clear, no obvious abnormalities on inspection of external nose and ears, normal appearance of ear canals and TMs, clear nasal congestion, mild post oropharyngeal erythema with PND, no tonsillar edema or exudate, no sinus TTP  NECK: no obvious masses on inspection  LUNGS: clear to auscultation bilaterally, no wheezes, rales or rhonchi, good air movement,   CV: HRRR, no peripheral edema  MS: moves all extremities without noticeable abnormality  PSYCH: pleasant and cooperative, no obvious depression or anxiety  ASSESSMENT AND PLAN:  Discussed the following assessment and plan:  Viral upper respiratory tract infection  -given HPI and exam findings today, a serious infection or illness is  unlikely. We discussed potential etiologies, with VURI being most likely, and advised supportive care and monitoring. We discussed treatment side effects, likely course, antibiotic misuse, transmission, and signs of developing a serious illness. -flu test per her wishes, but she did opt not to do tamiflu if positive, she just wanted to check given children at home -of course, we advised to return or notify a doctor immediately if symptoms worsen or persist or new concerns arise.    Patient Instructions  BEFORE YOU  LEAVE: -rapid flu test  INSTRUCTIONS FOR UPPER RESPIRATORY INFECTION:  -plenty of rest and fluids  -nasal saline wash 2-3 times daily (use prepackaged nasal saline or bottled/distilled water if making your own)   -can use AFRIN nasal spray for drainage and nasal congestion - but do NOT use longer then 3-4 days  -can use tylenol (in no history of liver disease) or ibuprofen (if no history of kidney disease, bowel bleeding or significant heart disease) as directed for aches and sorethroat  -in the winter time, using a humidifier at night is helpful (please follow cleaning instructions)  -if you are taking a cough medication - use only as directed, may also try a teaspoon of honey to coat the throat and throat lozenges. If given a cough medication with codeine or hydrocodone or other narcotic please be advised that this contains a strong and  potentially addicting medication. Please follow instructions carefully, take as little as possible and only use AS NEEDED for severe cough. Discuss potential side effects with your pharmacy. Please do not drive or operate machinery while taking these types of medications. Please do not take other sedating medications, drugs or alcohol while taking this medication without discussing with your doctor.  -for sore throat, salt water gargles can help  -follow up if you have fevers, facial pain, tooth pain, difficulty breathing or are worsening or symptoms persist longer then expected  Upper Respiratory Infection, Adult An upper respiratory infection (URI) is also known as the common cold. It is often caused by a type of germ (virus). Colds are easily spread (contagious). You can pass it to others by kissing, coughing, sneezing, or drinking out of the same glass. Usually, you get better in 1 to 3  weeks.  However, the cough can last for even longer. HOME CARE   Only take medicine as told by your doctor. Follow instructions provided above.  Drink enough water  and fluids to keep your pee (urine) clear or pale yellow.  Get plenty of rest.  Return to work when your temperature is < 100 for 24 hours or as told by your doctor. You may use a face mask and wash your hands to stop your cold from spreading. GET HELP RIGHT AWAY IF:   After the first few days, you feel you are getting worse.  You have questions about your medicine.  You have chills, shortness of breath, or red spit (mucus).  You have pain in the face for more then 1-2 days, especially when you bend forward.  You have a fever, puffy (swollen) neck, pain when you swallow, or white spots in the back of your throat.  You have a bad headache, ear pain, sinus pain, or chest pain.  You have a high-pitched whistling sound when you breathe in and out (wheezing).  You cough up blood.  You have sore muscles or a stiff neck. MAKE SURE YOU:   Understand these instructions.  Will watch your condition.  Will get help right away  if you are not doing well or get worse. Document Released: 10/10/2007 Document Revised: 07/16/2011 Document Reviewed: 07/29/2013 [facility] Patient Information 2015 [place], [place]. This information is not intended to replace advice given to you by your health care provider. Make sure you discuss any questions you have with your health care provider.    Colin Benton R., DO

## 2017-05-16 ENCOUNTER — Encounter: Payer: Self-pay | Admitting: Family Medicine

## 2017-07-29 MED FILL — NIFEDIPINE ER 30 MG TABLET: 30 | 30 days supply | Qty: 60 | Fill #0

## 2017-10-02 MED FILL — NIFEDIPINE ER 30 MG TABLET: 30 | 30 days supply | Qty: 60 | Fill #1

## 2017-11-28 MED FILL — NIFEDIPINE ER 30 MG TABLET: 30 | 30 days supply | Qty: 60 | Fill #2

## 2018-01-27 MED FILL — NIFEDIPINE ER 30 MG TABLET: 30 | 30 days supply | Qty: 60 | Fill #0

## 2018-08-09 MED FILL — NIFEDIPINE ER OSMOTIC RELEA: 30 | 30 days supply | Qty: 60 | Fill #1

## 2018-09-02 MED FILL — NIFEDIPINE ER OSMOTIC RELEA: 30 | 30 days supply | Qty: 60 | Fill #2

## 2018-10-02 MED FILL — NIFEDIPINE ER OSMOTIC RELEA: 30 | 30 days supply | Qty: 60 | Fill #3

## 2018-11-01 MED FILL — NIFEDIPINE ER OSMOTIC RELEA: 30 | 30 days supply | Qty: 60 | Fill #4

## 2018-12-01 MED FILL — NIFEDIPINE ER OSMOTIC RELEA: 30 | 30 days supply | Qty: 60 | Fill #5

## 2018-12-31 MED FILL — NIFEDIPINE ER OSMOTIC RELEA: 30 | 30 days supply | Qty: 60 | Fill #0

## 2019-04-16 IMAGING — US US PELVIS COMPLETE
1 series · 15 of 25 positions shown · non-contrast
Comparison: None

CLINICAL DATA: Heavy vaginal bleeding, IUD placed 12 weeks ago



[Series 1: us pelvis complete · 63 acquisitions, 15 frames shown]
[im 1/63]
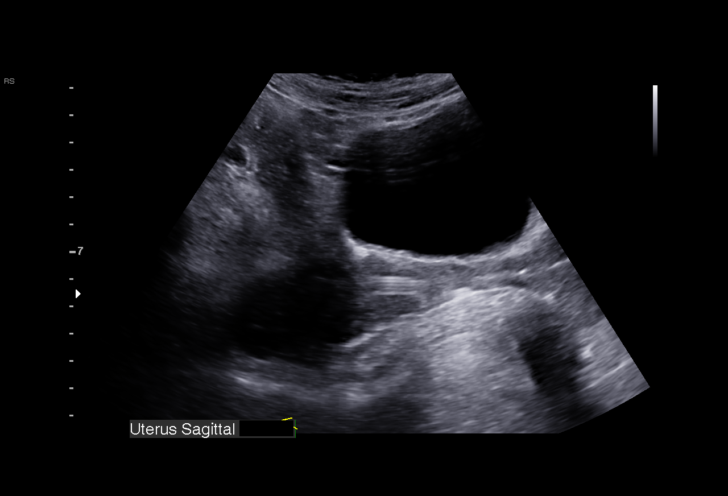
[im 6/63]
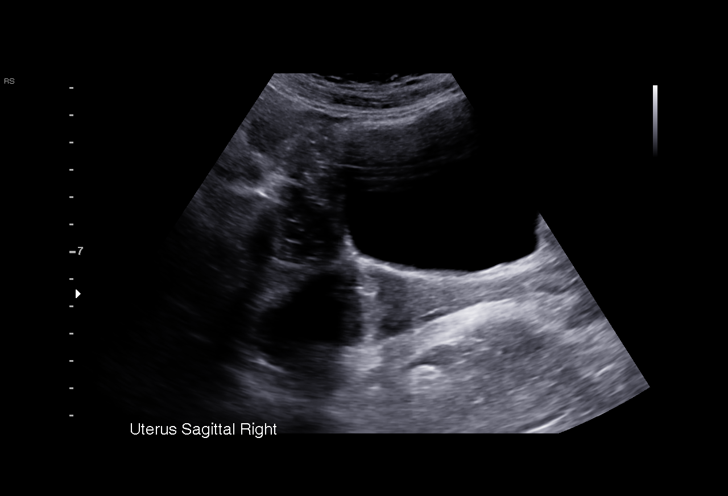
[im 11/63]
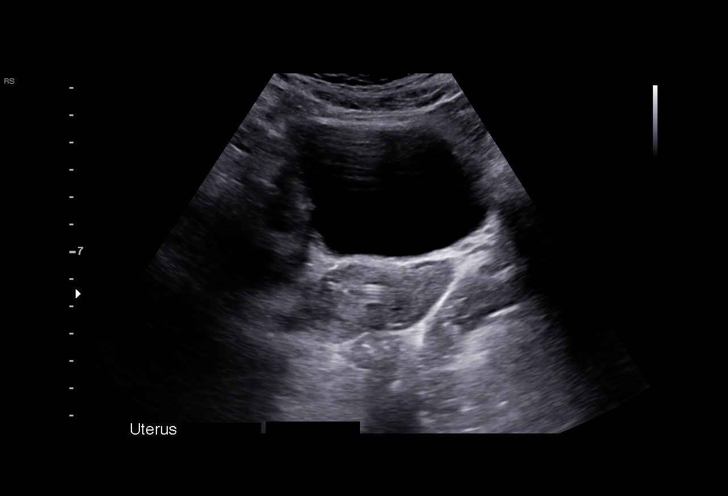
[im 13/63]
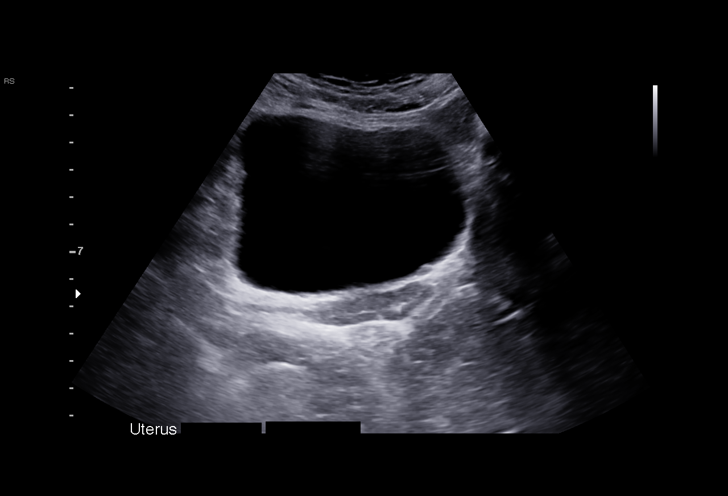
[im 19/63]
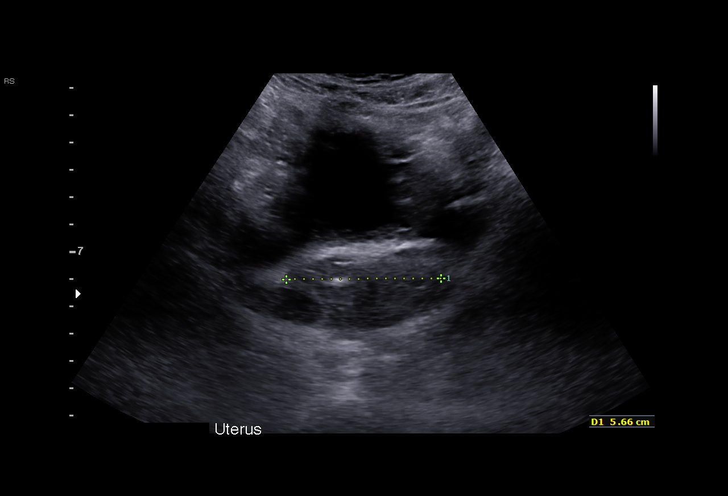
[im 24/63]
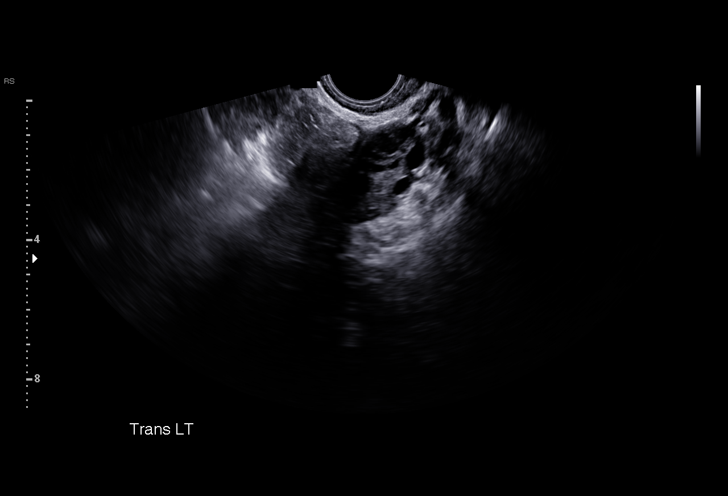
[im 26/63]
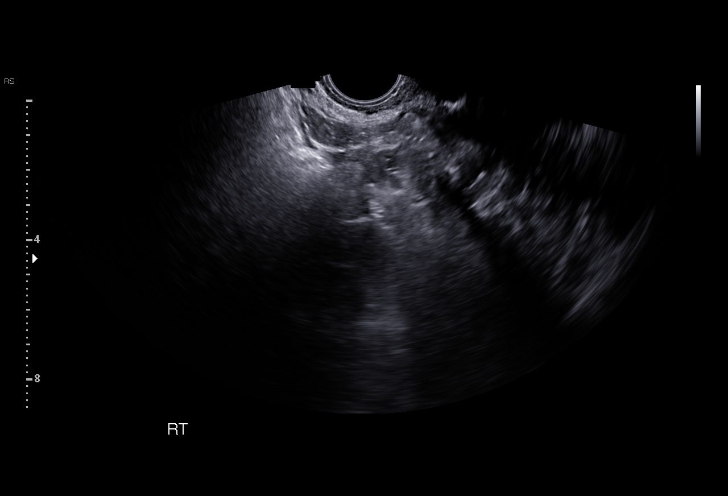
[im 32/63]
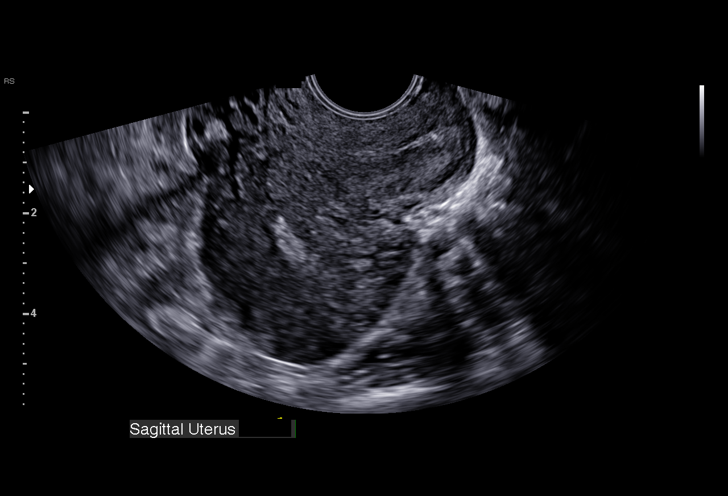
[im 37/63]
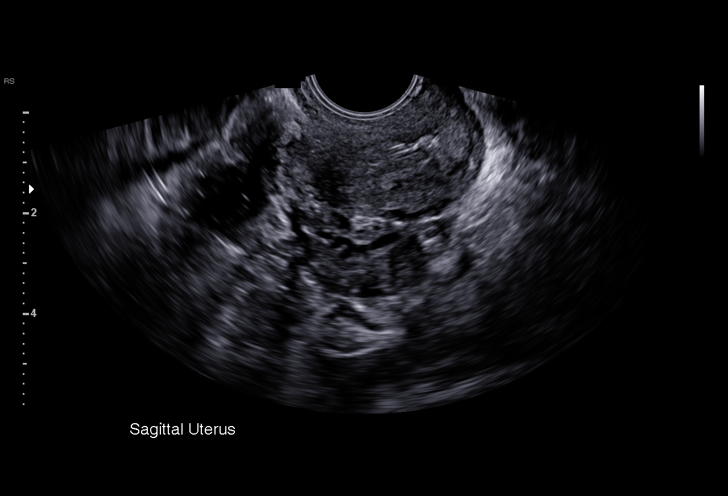
[im 39/63]
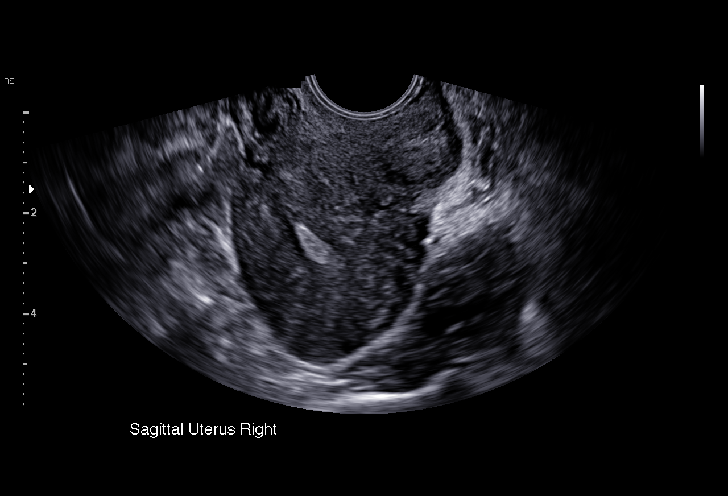
[im 44/63]
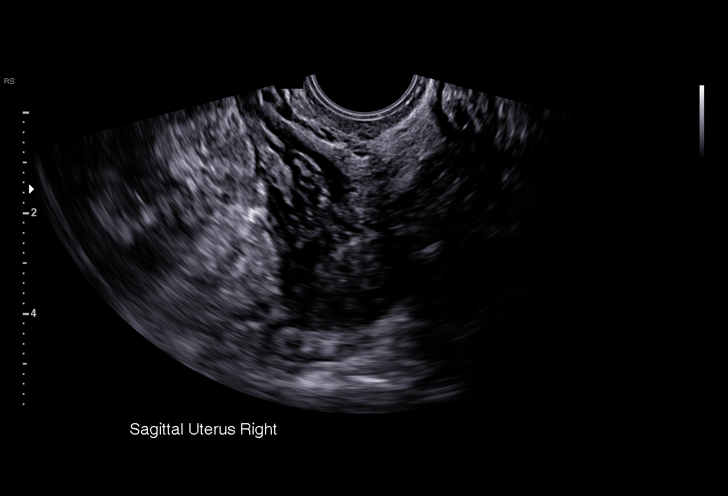
[im 50/63]
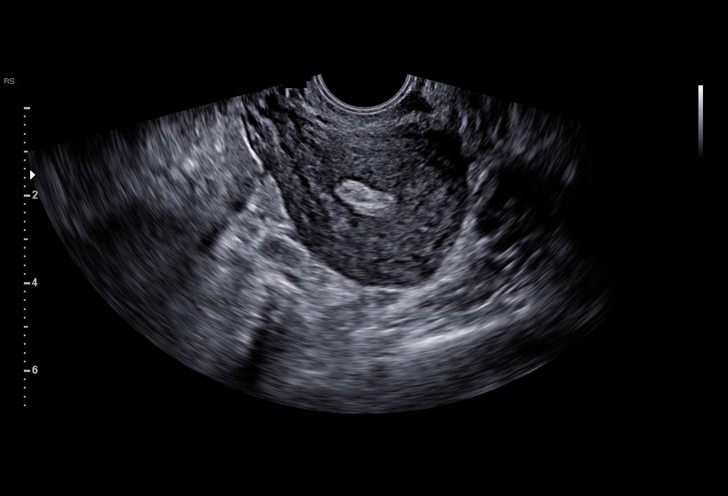
[im 52/63]
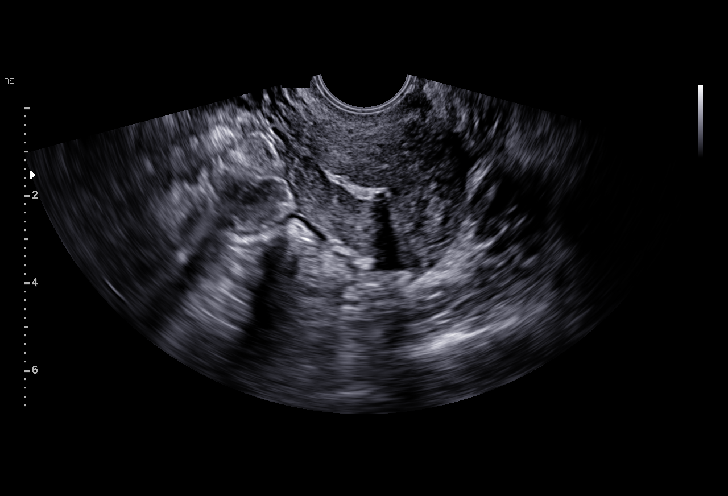
[im 57/63]
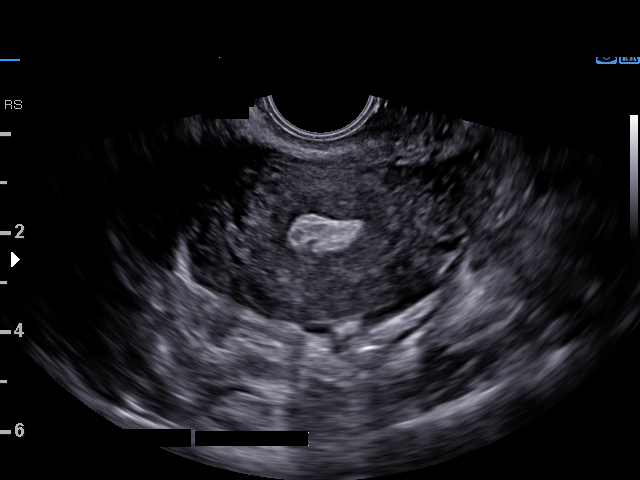
[im 63/63]
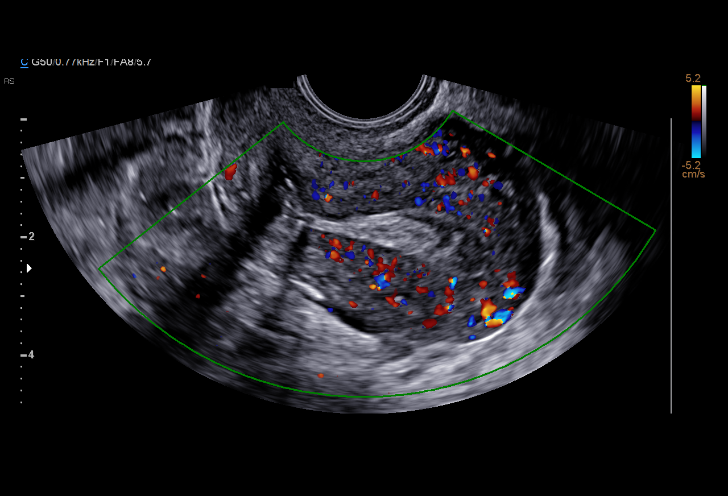

[15 of 25 positions shown; findings below may reference images not displayed]

FINDINGS: Uterus

Measurements: 8.8 x 3.9 x 4.4 cm. No fibroids or other mass
visualized.

Endometrium

Thickness: 11 mm. Mild focal thickening versus blood products in the
uterine fundus. IUD in satisfactory position.

Right ovary

Measurements: 3.3 x 1.8 x 2.6 cm. Normal appearance/no adnexal mass.

Left ovary

Measurements: 3.7 x 2.1 x 1.9 cm. Normal appearance/no adnexal mass.

Other findings

No abnormal free fluid.
IMPRESSION: IUD in satisfactory position.

## 2019-06-04 ENCOUNTER — Other Ambulatory Visit: Payer: Self-pay

## 2019-06-04 ENCOUNTER — Encounter: Payer: Self-pay | Admitting: Family Medicine

## 2019-06-04 ENCOUNTER — Telehealth (INDEPENDENT_AMBULATORY_CARE_PROVIDER_SITE_OTHER): Payer: No Typology Code available for payment source | Admitting: Family Medicine

## 2019-06-04 DIAGNOSIS — I10 Essential (primary) hypertension: Secondary | ICD-10-CM

## 2019-06-04 DIAGNOSIS — L299 Pruritus, unspecified: Secondary | ICD-10-CM

## 2019-06-04 DIAGNOSIS — R21 Rash and other nonspecific skin eruption: Secondary | ICD-10-CM | POA: Diagnosis not present

## 2019-06-04 MED ORDER — TRIAMCINOLONE ACETONIDE 0.1 % EX CREA: 1.0000 "application " | TOPICAL_CREAM | Freq: Two times a day (BID) | CUTANEOUS | 0 refills | Status: DC

## 2019-06-04 NOTE — Progress Notes (Signed)
Virtual Visit via Video Note  I connected with Vicki Rodriguez  on 06/04/19 at 10:00 AM EST by a video enabled telemedicine application and verified that I am speaking with the correct person using two identifiers.  Location patient: home Location provider:work or home office Persons participating in the virtual visit: patient, provider  I discussed the limitations of evaluation and management by telemedicine and the availability of in person appointments. The patient expressed understanding and agreed to proceed.   HPI:  Acute visit for skin issues: -started about a month ago -can't think of any new soap, detergent, shaving creams -itchy skin on the legs, various areas on both legs, not one spot - once scratches it it gets a little bumpy -the most helpful home treatment has been topical hct cr -skin is not that dry she does thinks -no increased showers or hot tubs -she wants to see her dermatologist ([place] [facility] - DR. Whitworth), she needs a referral for her insurance -denies rash or issues elsewhere -does not appear to be around veins and not in nerve distribution -hx HTN, checks at home and has been good 120s/80 - she exercises and eats health, no cp, headaches  - no longer on IUD and much better, no longer takes medication   ROS: See pertinent positives and negatives per HPI.  Past Medical History:  Diagnosis Date  . GERD (gastroesophageal reflux disease)    pregnancy related  . 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 PONV (postoperative nausea and vomiting)   . Seasonal allergies     Past Surgical History:  Procedure Laterality Date  . CESAREAN SECTION  2014   x 1  . CESAREAN SECTION WITH BILATERAL TUBAL LIGATION Bilateral 07/07/2015    Family History  Problem Relation Age of Onset  . Hypertension Father     SOCIAL HX: see hpi   Current Outpatient Medications:  Marland Kitchen  Multiple Vitamin (MULTIVITAMIN WITH MINERALS) TABS tablet, Take 1 tablet by mouth daily., Disp: , Rfl:  .   triamcinolone cream (KENALOG) 0.1 %, Apply 1 application topically 2 (two) times daily., Disp: 30 g, Rfl: 0  EXAM:  VITALS per patient if applicable:  GENERAL: alert, oriented, appears well and in no acute distress  HEENT: atraumatic, conjunttiva clear, no obvious abnormalities on inspection of external nose and ears  NECK: normal movements of the head and neck  LUNGS: on inspection no signs of respiratory distress, breathing rate appears normal, no obvious gross SOB, gasping or wheezing  CV: no obvious cyanosis  MS: moves all visible extremities without noticeable abnormality  PSYCH/NEURO: pleasant and cooperative, no obvious depression or anxiety, speech and thought processing grossly intact  ASSESSMENT AND PLAN:  Discussed the following assessment and plan:  Pruritus  Rash  Hypertension, unspecified type  -we discussed possible serious and likely etiologies, options for evaluation and workup, limitations of telemedicine visit vs in person visit, treatment, treatment risks and precautions. For the itchy skin - she plans to see her dermatologist and had called for a referral. Did discuss causes and most likely is eczema - in interim can try emollient and topical triam cr (sent rx). Also possible short course of antihistamine. Has not been seen in some times. BP stable - a little above goal - discussed. Of medications now. Reports OB did lab workup for this and since dc of IUD much better. Continuing healthy diet and exercise for this, advised of goals, monitor. Patient agrees to seek prompt in person care if worsening,  new symptoms arise, or if is not improving with treatment.   I discussed the assessment and treatment plan with the patient. The patient was provided an opportunity to ask questions and all were answered. The patient agreed with the plan and demonstrated an understanding of the instructions.   The patient was advised to call back or seek an in-person evaluation if  the symptoms worsen or if the condition fails to improve as anticipated.   Lucretia Kern, DO

## 2019-06-26 ENCOUNTER — Encounter: Payer: No Typology Code available for payment source | Admitting: Family Medicine

## 2019-08-29 ENCOUNTER — Other Ambulatory Visit: Payer: Self-pay

## 2019-08-29 ENCOUNTER — Emergency Department (HOSPITAL_COMMUNITY)
Admission: EM | Admit: 2019-08-29 | Discharge: 2019-08-29 | Disposition: A | Discharge status: Home / Self Care | Payer: No Typology Code available for payment source | Attending: Emergency Medicine | Admitting: Emergency Medicine

## 2019-08-29 ENCOUNTER — Emergency Department (HOSPITAL_COMMUNITY): Payer: No Typology Code available for payment source

## 2019-08-29 DIAGNOSIS — R002 Palpitations: Secondary | ICD-10-CM | POA: Insufficient documentation

## 2019-08-29 DIAGNOSIS — E876 Hypokalemia: Secondary | ICD-10-CM | POA: Diagnosis not present

## 2019-08-29 DIAGNOSIS — I10 Essential (primary) hypertension: Secondary | ICD-10-CM | POA: Diagnosis not present

## 2019-08-29 DIAGNOSIS — Z79899 Other long term (current) drug therapy: Secondary | ICD-10-CM | POA: Diagnosis not present

## 2019-08-29 LAB — CBC
HCT: 41.3 % (ref 36.0–46.0)
Hemoglobin: 13.7 g/dL (ref 12.0–15.0)
MCH: 29 pg (ref 26.0–34.0)
MCHC: 33.2 g/dL (ref 30.0–36.0)
MCV: 87.5 fL (ref 80.0–100.0)
Platelets: 241 10*3/uL (ref 150–400)
RBC: 4.72 MIL/uL (ref 3.87–5.11)
RDW: 13.2 % (ref 11.5–15.5)
WBC: 7.4 10*3/uL (ref 4.0–10.5)
nRBC: 0 % (ref 0.0–0.2)

## 2019-08-29 LAB — BASIC METABOLIC PANEL
Anion gap: 10 (ref 5–15)
BUN: 15 mg/dL (ref 6–20)
CO2: 26 mmol/L (ref 22–32)
Calcium: 9.3 mg/dL (ref 8.9–10.3)
Chloride: 103 mmol/L (ref 98–111)
Creatinine, Ser: 1.04 mg/dL — ABNORMAL HIGH (ref 0.44–1.00)
GFR calc Af Amer: >60 mL/min (ref >60)
GFR calc non Af Amer: >60 mL/min (ref >60)
Glucose, Bld: 123 mg/dL — ABNORMAL HIGH (ref 70–99)
Potassium: 3 mmol/L — ABNORMAL LOW (ref 3.5–5.1)
Sodium: 139 mmol/L (ref 135–145)

## 2019-08-29 LAB — I-STAT BETA HCG BLOOD, ED (MC, WL, AP ONLY): I-stat hCG, quantitative: <5 m[IU]/mL (ref <5)

## 2019-08-29 LAB — MAGNESIUM: Magnesium: 2.1 mg/dL (ref 1.7–2.4)

## 2019-08-29 LAB — TROPONIN I (HIGH SENSITIVITY)
Troponin I (High Sensitivity): 3 ng/L (ref <18)
Troponin I (High Sensitivity): 4 ng/L (ref <18)

## 2019-08-29 MED ORDER — POTASSIUM CHLORIDE ER 10 MEQ PO TBCR: 10.0000 meq | EXTENDED_RELEASE_TABLET | Freq: Every day | ORAL | 0 refills | Status: DC

## 2019-08-29 MED ORDER — POTASSIUM CHLORIDE CRYS ER 20 MEQ PO TBCR
40.0000 meq | EXTENDED_RELEASE_TABLET | Freq: Once | ORAL | Status: AC
  Administered 2019-08-29: 40 meq via ORAL
  Filled 2019-08-29: qty 2

## 2019-08-29 NOTE — Discharge Instructions (Signed)
Your potassium was mildly low today (3.0).  I recommend that you take this potassium supplement once daily for 30 days.  Your primary care doctor should recheck your level in 1-2 weeks.  If it has normalized, you do not need to keep taking potassium.  You can add a daily banana or other potassium food to your diet.  For your palpitations, I would like you to follow up with cardiology in the office.   Please call to schedule an appointment in the next 1-2 weeks if possible.  They may wish to perform additional testing of your heart.  You should try to stop drinking caffeine for the next 7 days, and cut out alcohol or nicotine, which are heart irritants.  If your symptoms become persistent, or you become lightheaded or short of breath, please return to the ER immediately.  Most cases of "PVC's" are benign, but in rare circumstances they can develop into more serious heart issues or arrhythmias that need immediate attention.  Your blood pressure was also high today.  Please keep an eye on this and discuss your blood pressure medications with your PCP.

## 2019-08-29 NOTE — ED Provider Notes (Signed)
[facility] EMERGENCY DEPARTMENT Provider Note   CSN: [id number] Arrival date & time: 08/29/19  0409     History Chief Complaint  Patient presents with  . Palpitations    Vicki Rodriguez is a 34 y.o. female with a history of hypertension, reflux, who works here as a Press photographer, presented to emergency department palpitations.  She reports onset early this morning around 145 while she was working.  She says she has been feeling skipped beats in her chest.  She occasionally feels 1 every couple seconds or minutes, but occasionally feel 1 or 2 in a row.  She denies any lightheadedness with this.  She has never happened to her before.  She did she had an EKG of stairs while at work which he brought with her down here, which shows an isolated PVC and QTc 450's.  She reports she does drink 1 coffee over the course of the evening (she works night shift) and occasional also drink a second coffee in the daytime.  She does not drink alcohol regularly does not use any other recreational drugs.  She does not smoke.  She does not report any other significant stressors in her life.  She has a rheumatologist any issues with her heart.  She has never seen a cardiologist.  She does report a family history of cardiac disease in her father.  There is no family history of sudden death.  HPI    Past Medical History:  Diagnosis Date  . GERD (gastroesophageal reflux disease)    pregnancy related  . 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 PONV (postoperative nausea and vomiting)   . Seasonal allergies     Patient Active Problem List   Diagnosis Date Noted  . S/P cesarean section 07/07/2015    Past Surgical History:  Procedure Laterality Date  . CESAREAN SECTION  2014   x 1  . CESAREAN SECTION WITH BILATERAL TUBAL LIGATION Bilateral 07/07/2015     OB History    Gravida  2   Para  2   Term  2   Preterm      AB      Living  2     SAB      TAB      Ectopic      Multiple  0   Live Births  2           Family History  Problem Relation Age of Onset  . Hypertension Father     Social History   Tobacco Use  . Smoking status: Never Smoker  . Smokeless tobacco: Never Used  Substance Use Topics  . Alcohol use: No  . Drug use: No    Home Medications Prior to Admission medications   Medication Sig Start Date End Date Taking? Authorizing Provider  Multiple Vitamin (MULTIVITAMIN WITH MINERALS) TABS tablet Take 1 tablet by mouth daily.    [provider]  potassium chloride (KLOR-CON) 10 MEQ tablet Take 1 tablet (10 mEq total) by mouth daily. 08/29/19 09/28/19  Wyvonnia Dusky, MD  triamcinolone cream (KENALOG) 0.1 % Apply 1 application topically 2 (two) times daily. 06/04/19   Lucretia Kern, DO    Allergies    Patient has no known allergies.  Review of Systems   Review of Systems  Constitutional: Negative for chills and fever.  Eyes: Negative for pain and visual disturbance.  Respiratory: Positive for shortness of breath. Negative for cough.   Cardiovascular:  Positive for palpitations. Negative for chest pain.  Gastrointestinal: Negative for abdominal pain and vomiting.  Neurological: Negative for syncope and light-headedness.  Psychiatric/Behavioral: Negative for agitation and confusion.  All other systems reviewed and are negative.   Physical Exam Updated Vital Signs BP (!) 161/100 (BP Location: Right Arm)   Pulse 86   Temp 98 F (36.7 C) (Oral)   Resp 18   LMP 08/06/2019 (Approximate)   SpO2 100%   Physical Exam Vitals and nursing note reviewed.  Constitutional:      General: She is not in acute distress.    Appearance: She is well-developed.  HENT:     Head: Normocephalic and atraumatic.  Eyes:     Conjunctiva/sclera: Conjunctivae normal.     Pupils: Pupils are equal, round, and reactive to light.  Cardiovascular:     Rate and Rhythm: Normal rate and regular rhythm.     Pulses: Normal pulses.     Heart  sounds: No murmur.  Pulmonary:     Effort: Pulmonary effort is normal. No respiratory distress.     Breath sounds: Normal breath sounds.  Musculoskeletal:     Cervical back: Neck supple.  Skin:    General: Skin is warm and dry.  Neurological:     General: No focal deficit present.     Mental Status: She is alert and oriented to person, place, and time.  Psychiatric:        Mood and Affect: Mood normal.        Behavior: Behavior normal.     ED Results / Procedures / Treatments   Labs (all labs ordered are listed, but only abnormal results are displayed) Labs Reviewed  BASIC METABOLIC PANEL - Abnormal; Notable for the following components:      Result Value   Potassium 3.0 (*)    Glucose, Bld 123 (*)    Creatinine, Ser 1.04 (*)    All other components within normal limits  CBC  MAGNESIUM  I-STAT BETA HCG BLOOD, ED ([facility], [facility], [facility] ONLY)  TROPONIN I (HIGH SENSITIVITY)  TROPONIN I (HIGH SENSITIVITY)    EKG EKG Interpretation  Date/Time:  Saturday August 29 2019 04:09:48 EDT Ventricular Rate:  87 PR Interval:  142 QRS Duration: 88 QT Interval:  360 QTC Calculation: 433 R Axis:   69 Text Interpretation: Normal sinus rhythm Normal ECG No STEMI Confirmed by Octaviano Glow [phone]) on 08/29/2019 7:12:27 AM   Radiology DG Chest 2 View  Result Date: 08/29/2019 CLINICAL DATA:  Initial evaluation for acute palpitations, shortness of breath. EXAM: CHEST - 2 VIEW COMPARISON:  None. FINDINGS: The cardiac and mediastinal silhouettes are within normal limits. The lungs are normally inflated. No airspace consolidation, pleural effusion, or pulmonary edema. No pneumothorax. No acute osseous abnormality. IMPRESSION: No active cardiopulmonary disease. Electronically Signed   By: Jeannine Boga M.D.   On: 08/29/2019 05:51    Procedures Procedures (including critical care time)  Medications Ordered in ED Medications  potassium chloride SA (KLOR-CON) CR tablet 40 mEq (40 mEq Oral  Given 08/29/19 0831)    ED Course  I have reviewed the triage vital signs and the nursing notes.  Pertinent labs & imaging results that were available during my care of the patient were reviewed by me and considered in my medical decision making (see chart for details).  34 yo female here with palpitations beginning early this morning while a work around [employer].  Feeling skipped hear beats every few seconds or minutes.  No near  syncope.  No prior hx.  She has a PVC on her earlier ECG she presented with.  No active chest pain Labs ordered here including troponins were unremarkable aside from mild hypokalemia (3.0).  We can replete this.  It may be associated with her palpitations although this isn't clear.  Her Mg is normal.  I doubt this is ACS at her age without significant risk factors, no ischemic findings on ecg per my interpretation  She is not having persistent episodes lasting minutes to suggestive a more concerning arrhythmia or HD instability.    I would have her f/u with cardiology.  I advised cutting out caffeine and better sleep habits (she reports she has been up for 24 hours because she cares for kids at home as well).  I think she is stable for discharge.    Final Clinical Impression(s) / ED Diagnoses Final diagnoses:  Palpitations  Hypokalemia  Hypertension, unspecified type    Rx / DC Orders ED Discharge Orders         Ordered    potassium chloride (KLOR-CON) 10 MEQ tablet  Daily     08/29/19 0930           Wyvonnia Dusky, MD 08/29/19 1710

## 2019-08-29 NOTE — ED Notes (Signed)
Called lab RE adding on mag.

## 2019-08-29 NOTE — ED Notes (Signed)
Patient verbalizes understanding of discharge instructions. Opportunity for questioning and answers were provided. Armband removed by staff, pt discharged from ED.  

## 2019-08-29 NOTE — ED Triage Notes (Addendum)
Pt c/o palpitations that have been ongoing since 01:45. Denies SOB, N/V. Brought copy of EKG with her from earlier that shows occasional PVC.

## 2019-09-01 ENCOUNTER — Encounter: Payer: Self-pay | Admitting: Family Medicine

## 2019-09-01 ENCOUNTER — Telehealth (INDEPENDENT_AMBULATORY_CARE_PROVIDER_SITE_OTHER): Payer: No Typology Code available for payment source | Admitting: Family Medicine

## 2019-09-01 ENCOUNTER — Other Ambulatory Visit: Payer: Self-pay

## 2019-09-01 DIAGNOSIS — R002 Palpitations: Secondary | ICD-10-CM

## 2019-09-01 DIAGNOSIS — E876 Hypokalemia: Secondary | ICD-10-CM | POA: Diagnosis not present

## 2019-09-01 DIAGNOSIS — R7989 Other specified abnormal findings of blood chemistry: Secondary | ICD-10-CM

## 2019-09-01 NOTE — Progress Notes (Signed)
Virtual Visit via Telephone Note  I connected with Vicki Rodriguez on 09/01/19 at  5:40 PM EDT by telephone and verified that I am speaking with the correct person using two identifiers.   I discussed the limitations, risks, security and privacy concerns of performing an evaluation and management service by telephone and the availability of in person appointments. I also discussed with the patient that there may be a patient responsible charge related to this service. The patient expressed understanding and agreed to proceed.  Location patient: home Location provider: work or home office, [place] Participants present for the call: patient, provider Patient did not have a visit in the prior 7 days to address this/these issue(s).   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 Lyanne Co , she plans to recheck labs then -since that time she has felt pretty good, she had a few palpitations for 2 days and now they have resolved -she was low on sleep and drinks caffeine - two cups of coffee per day -she has been cutting back on coffee -denies CP, SOB, DOE, she reports she is pretty active, no illness recently except a stomach virus about 1 month ago -has not yet set up a new PCP, plans to do so soon -reviewed labs/ER notes/EKG   Observations/Objective: Patient sounds cheerful and well on the phone. I do not appreciate any SOB. Speech and thought processing are grossly intact. Patient reported vitals:  Assessment and  Plan:  Palpitations  Hypokalemia  Elevated serum creatinine  -we discussed possible serious and likely etiologies, options for evaluation and workup, limitations of telemedicine visit vs in person visit, treatment, treatment risks and precautions. Pt prefers to treat via telemedicine empirically rather then risking or undertaking an in person visit at this moment. Currently asymptomatic. Referral placed per her request - already has appt with cardiology for follow up. Advise will need recheck on labs and she plans to do so there. May want to stop mv and recheck if any residual issues. Advise she could do with cardiology or follow up with [facility] to establish PCP and/or telemedicine if needed in the interim.  Patient agrees to seek prompt in person care if worsening, new symptoms arise, or if is not improving with treatment.  Follow Up Instructions:   I did not refer this patient for an OV in the next 24 hours for this/these issue(s).  I discussed the assessment and treatment plan with the patient. The patient was provided an opportunity to ask questions and all were answered. The patient agreed with the plan and demonstrated an understanding of the instructions.   The patient was advised to call back or seek an in-person evaluation if the symptoms worsen or if the condition fails to improve as anticipated.  I provided 21 minutes of non-face-to-face time during this encounter.   Lucretia Kern, DO

## 2019-09-03 ENCOUNTER — Telehealth: Payer: Self-pay | Admitting: Radiology

## 2019-09-03 ENCOUNTER — Encounter: Payer: Self-pay | Admitting: Internal Medicine

## 2019-09-03 ENCOUNTER — Ambulatory Visit (INDEPENDENT_AMBULATORY_CARE_PROVIDER_SITE_OTHER): Payer: No Typology Code available for payment source | Admitting: Internal Medicine

## 2019-09-03 ENCOUNTER — Telehealth: Payer: No Typology Code available for payment source | Admitting: Family Medicine

## 2019-09-03 ENCOUNTER — Other Ambulatory Visit: Payer: Self-pay

## 2019-09-03 VITALS — BP 170/110 | HR 78 | Temp 97.7°F | Ht 65.0 in | Wt 130.4 lb

## 2019-09-03 DIAGNOSIS — R002 Palpitations: Secondary | ICD-10-CM

## 2019-09-03 DIAGNOSIS — I493 Ventricular premature depolarization: Secondary | ICD-10-CM | POA: Diagnosis not present

## 2019-09-03 DIAGNOSIS — E876 Hypokalemia: Secondary | ICD-10-CM | POA: Diagnosis not present

## 2019-09-03 DIAGNOSIS — R030 Elevated blood-pressure reading, without diagnosis of hypertension: Secondary | ICD-10-CM

## 2019-09-03 NOTE — Progress Notes (Signed)
Cardiology Office Note:    Date:  09/03/2019   ID:  Vicki Rodriguez, DOB 1985/10/27, MRN [id number] PCP:  Patient, No Pcp Per  Cardiologist:  No primary care provider on file.  Electrophysiologist:  None   Referring MD: Lucretia Kern, DO   Chief Complaint: palpitations, HTN  History of Present Illness:    Vicki Rodriguez is a 34 y.o. female with a history of elevated blood pressure and no other significant past medical history who presents today for evaluation of palpitations and elevated blood pressure.  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  She felt skipped beats.  Given her work area she was hooked up to the baby monitor which demonstrated PVCs, and subsequently her colleagues obtain an EKG which also demonstrated PVCs.  She brings this copy of ECG with her today and this has been scanned into the medical record.  Her colleagues encouraged her to go to the ER for evaluation, where she was found to be hypokalemic and was repleted.  She was ruled out for ACS.  She was noted to have been up for approximately 24 hours preceding this event.  It was recommended that she have close cardiology follow-up.  That was approximately 5 days prior to her presentation in the office today.  She denies chest pain or pressure, but does note a sense of chest soreness.  Her symptoms lasted Friday through Monday with intermittent palpitations.   Family history significant for her paternal grandfather who had an MI before age 26 with sudden cardiac death.  She notes that the men in her family have heart disease.  She is quite hypertensive today and was hypertensive in the emergency department as well.  She is uncertain why that is, but notes that when she was in nursing school she also had elevated blood pressure.  She has not had a secondary work-up for hypertension.    Past Medical History:  Diagnosis Date  . GERD (gastroesophageal reflux  disease)    pregnancy related  . 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 PONV (postoperative nausea and vomiting)   . Seasonal allergies     Past Surgical History:  Procedure Laterality Date  . CESAREAN SECTION  2014   x 1  . CESAREAN SECTION WITH BILATERAL TUBAL LIGATION Bilateral 07/07/2015    Current Medications: Current Meds  Medication Sig  . Multiple Vitamin (MULTIVITAMIN WITH MINERALS) TABS tablet Take 1 tablet by mouth daily.  . potassium chloride (KLOR-CON) 10 MEQ tablet Take 1 tablet (10 mEq total) by mouth daily.     Allergies:   Patient has no known allergies.   Social History   Socioeconomic History  . Marital status: Married    Spouse name: Not on file  . Number of children: Not on file  . Years of education: Not on file  . Highest education level: Not on file  Occupational History  . Not on file  Tobacco Use  . Smoking status: Never Smoker  . Smokeless tobacco: Never Used  Substance and Sexual Activity  . Alcohol use: No  . Drug use: No  . Sexual activity: Yes  Other Topics Concern  . Not on file  Social History Narrative   Work or School: Marine scientist, Therapist, music at [employer], full-time, night shifts      Home Situation: Lives with husband and 2 daughters, age 6 and 31 months in November/2018  Spiritual Beliefs: Christian      Lifestyle: No regular exercise, healthy diet   Social Determinants of Health   Financial Resource Strain:   . Difficulty of Paying Living Expenses:   Food Insecurity:   . Worried About Charity fundraiser in the Last Year:   . Arboriculturist in the Last Year:   Transportation Needs:   . Film/video editor (Medical):   Marland Kitchen Lack of Transportation (Non-Medical):   Physical Activity:   . Days of Exercise per Week:   . Minutes of Exercise per Session:   Stress:   . Feeling of Stress :   Social Connections:   . Frequency of Communication with Friends and Family:   . Frequency of Social Gatherings with Friends and Family:   .  Attends Religious Services:   . Active Member of Clubs or Organizations:   . Attends Archivist Meetings:   Marland Kitchen Marital Status:      Family History: The patient's family history includes Hypertension in her father.  ROS:   Please see the history of present illness.    All other systems reviewed and are negative.  EKGs/Labs/Other Studies Reviewed:    The following studies were reviewed today:  EKG:  NSR from 4/24 NSR with PVC 4/24  Recent Labs: 08/29/2019: Hemoglobin 13.7; Magnesium 2.1; Platelets 241 09/03/2019: BUN 20; Creatinine, Ser 1.02; Potassium 4.8; Sodium 142  Recent Lipid Panel    Component Value Date/Time   CHOL 149 03/26/2017 0920   TRIG 45.0 03/26/2017 0920   HDL 42.30 03/26/2017 0920   CHOLHDL 4 03/26/2017 0920   VLDL 9.0 03/26/2017 0920   LDLCALC 97 03/26/2017 0920    Physical Exam:    VS:  BP (!) 170/110 (BP Location: Right Arm, Cuff Size: Normal)   Pulse 78   Temp 97.7 F (36.5 C)   Ht _0  (1.651 m)   Wt 130 lb 6.4 oz (59.1 kg)   LMP 08/29/2019 (Exact Date)   SpO2 99%   BMI 21.70 kg/m     Wt Readings from Last 5 Encounters:  09/03/19 130 lb 6.4 oz (59.1 kg)  07/06/15 161 lb (73 kg)  06/21/15 160 lb (72.6 kg)     Constitutional: No acute distress Eyes: sclera non-icteric, normal conjunctiva and lids ENMT: normal dentition, moist mucous membranes Cardiovascular: regular rhythm, normal rate, no murmurs. S1 and S2 normal. Radial pulses normal bilaterally. No jugular venous distention.  Respiratory: clear to auscultation bilaterally GI : normal bowel sounds, soft and nontender. No distention.   MSK: extremities warm, well perfused. No edema.  NEURO: grossly nonfocal exam, moves all extremities. PSYCH: alert and oriented x 3, normal mood and affect.   ASSESSMENT:    1. PVC (premature ventricular contraction)   2. Palpitations   3. Hypokalemia   4. Elevated blood pressure reading in office without diagnosis of hypertension     PLAN:    PVC (premature ventricular contraction) - Plan: ECHOCARDIOGRAM COMPLETE, LONG TERM MONITOR (3-14 DAYS), CANCELED: LONG TERM MONITOR-LIVE TELEMETRY (3-14 DAYS)  Palpitations - Plan: ECHOCARDIOGRAM COMPLETE, Basic Metabolic Panel (BMET), LONG TERM MONITOR (3-14 DAYS), CANCELED: LONG TERM MONITOR-LIVE TELEMETRY (3-14 DAYS)  Hypokalemia - Plan: Basic Metabolic Panel (BMET), Aldosterone + renin activity w/ ratio  Elevated blood pressure reading in office without diagnosis of hypertension - Plan: Aldosterone + renin activity w/ ratio   For palpitations that are likely PVCs we will obtain a cardiac monitor to quantitate her burden of PVCs.  We will also obtain an echocardiogram given sense of chest soreness and PVCs to exclude structural causes.  She has not had a secondary work-up for hypertension and her blood pressure is very high.  We will obtain aldosterone and renin levels as well as a repeat be met given hypokalemia.  She will need a renal ultrasound to rule out renal artery stenosis.  Given that she is a young female, would be important to evaluate for FMD.  We will start amlodipine 5 mg a day for treatment of hypertension and follow closely.  Cherlynn Kaiser, MD   [facility]    Medication Adjustments/Labs and Tests Ordered: Current medicines are reviewed at length with the patient today.  Concerns regarding medicines are outlined above.  Orders Placed This Encounter  Procedures  . Basic Metabolic Panel (BMET)  . Aldosterone + renin activity w/ ratio  . LONG TERM MONITOR (3-14 DAYS)  . ECHOCARDIOGRAM COMPLETE   No orders of the defined types were placed in this encounter.   Patient Instructions  Medication Instructions:  NONE. *If you need a refill on your cardiac medications before your next appointment, please call your pharmacy*   Lab Work: BMP TODAY. If you have labs (blood work) drawn today and your tests are completely normal, you will receive  your results only by: Marland Kitchen MyChart Message (if you have MyChart) OR . A paper copy in the mail If you have any lab test that is abnormal or we need to change your treatment, we will call you to review the results.   Testing/Procedures: WILL BE SCHEDULED AT [place] [address]. Your physician has requested that you have an echocardiogram. Echocardiography is a painless test that uses sound waves to create images of your heart. It provides your doctor with information about the size and shape of your heart and how well your heart's chambers and valves are working. This procedure takes approximately one hour. There are no restrictions for this procedure.  AND  Your physician has recommended that you wear a 14 DAY ZIO-PATCH monitor. The Zio patch cardiac monitor continuously records heart rhythm data for up to 14 days, this is for patients being evaluated for multiple types heart rhythms. For the first 24 hours post application, please avoid getting the Zio monitor wet in the shower or by excessive sweating during exercise. After that, feel free to carry on with regular activities. Keep soaps and lotions away from the ZIO XT Patch.  This will be mailed to you, please expect 7-10 days to receive.   [employer] location - [place], [address].           Follow-Up: At [facility], you and your health needs are our priority.  As part of our continuing mission to provide you with exceptional heart care, we have created designated Provider Care Teams.  These Care Teams include your primary Cardiologist (physician) and Advanced Practice Providers (APPs -  Physician Assistants and Nurse Practitioners) who all work together to provide you with the care you need, when you need it.   Your next appointment:   2 month(s) 2nd or 3rd week in June  The format for your next appointment:   In Person  Provider:   Cherlynn Kaiser, MD   Other Instructions  monitor blood pressure - keep  a log _may contact office if blood pressure continue to be elevated   ZIO XT- Long Term Monitor Instructions   Your physician has requested  you wear your ZIO patch monitor___14____days.   This is a single patch monitor.  [employer] supplies one patch monitor per enrollment.  Additional stickers are not available.   Please do not apply patch if you will be having a Nuclear Stress Test, Echocardiogram, Cardiac CT, MRI, or Chest Xray during the time frame you would be wearing the monitor. The patch cannot be worn during these tests.  You cannot remove and re-apply the ZIO XT patch monitor.   Your ZIO patch monitor will be sent USPS Priority mail from [facility] directly to your home address. The monitor may also be mailed to a PO BOX if home delivery is not available.   It may take 3-5 days to receive your monitor after you have been enrolled.   Once you have received you monitor, please review enclosed instructions.  Your monitor has already been registered assigning a specific monitor serial # to you.   Applying the monitor   Shave hair from upper left chest.   Hold abrader disc by orange tab.  Rub abrader in 40 strokes over left upper chest as indicated in your monitor instructions.   Clean area with 4 enclosed alcohol pads .  Use all pads to assure are is cleaned thoroughly.  Let dry.   Apply patch as indicated in monitor instructions.  Patch will be place under collarbone on left side of chest with arrow pointing upward.   Rub patch adhesive wings for 2 minutes.Remove white label marked "1".  Remove white label marked "2".  Rub patch adhesive wings for 2 additional minutes.   While looking in a mirror, press and release button in center of patch.  A small green light will flash 3-4 times .  This will be your only indicator the monitor has been turned on.     Do not shower for the first 24 hours.  You may shower after the first 24 hours.   Press button if you feel a symptom.  You will hear a small click.  Record Date, Time and Symptom in the Patient Log Book.   When you are ready to remove patch, follow instructions on last 2 pages of Patient Log Book.  Stick patch monitor onto last page of Patient Log Book.   Place Patient Log Book in [place] box.  Use locking tab on box and tape box closed securely.  The [employer] and [employer] has [employer] on it.  Please place in mailbox as soon as possible.  Your physician should have your test results approximately 7 days after the monitor has been mailed back to [facility].   Call [place] at [phone] if you have questions regarding your ZIO XT patch monitor.  Call them immediately if you see an orange light blinking on your monitor.   If your monitor falls off in less than 4 days contact our Monitor department at [phone].  If your monitor becomes loose or falls off after 4 days call [employer] at [phone] for suggestions on securing your monitor.

## 2019-09-03 NOTE — Patient Instructions (Addendum)
Medication Instructions:  NONE. *If you need a refill on your cardiac medications before your next appointment, please call your pharmacy*   Lab Work: BMP TODAY. If you have labs (blood work) drawn today and your tests are completely normal, you will receive your results only by: Marland Kitchen MyChart Message (if you have MyChart) OR . A paper copy in the mail If you have any lab test that is abnormal or we need to change your treatment, we will call you to review the results.   Testing/Procedures: WILL BE SCHEDULED AT [facility]. Your physician has requested that you have an echocardiogram. Echocardiography is a painless test that uses sound waves to create images of your heart. It provides your doctor with information about the size and shape of your heart and how well your heart's chambers and valves are working. This procedure takes approximately one hour. There are no restrictions for this procedure.  AND  Your physician has recommended that you wear a 14 DAY ZIO-PATCH monitor. The Zio patch cardiac monitor continuously records heart rhythm data for up to 14 days, this is for patients being evaluated for multiple types heart rhythms. For the first 24 hours post application, please avoid getting the Zio monitor wet in the shower or by excessive sweating during exercise. After that, feel free to carry on with regular activities. Keep soaps and lotions away from the ZIO XT Patch.  This will be mailed to you, please expect 7-10 days to receive.   [employer] location - [place], [address].           Follow-Up: At [facility], you and your health needs are our priority.  As part of our continuing mission to provide you with exceptional heart care, we have created designated Provider Care Teams.  These Care Teams include your primary Cardiologist (physician) and Advanced Practice Providers (APPs -  Physician Assistants and Nurse Practitioners) who all work together to  provide you with the care you need, when you need it.   Your next appointment:   2 month(s) 2nd or 3rd week in June  The format for your next appointment:   In Person  Provider:   Cherlynn Kaiser, MD   Other Instructions  monitor blood pressure - keep a log _may contact office if blood pressure continue to be elevated   ZIO XT- Long Term Monitor Instructions   Your physician has requested you wear your ZIO patch monitor___14____days.   This is a single patch monitor.  [employer] supplies one patch monitor per enrollment.  Additional stickers are not available.   Please do not apply patch if you will be having a Nuclear Stress Test, Echocardiogram, Cardiac CT, MRI, or Chest Xray during the time frame you would be wearing the monitor. The patch cannot be worn during these tests.  You cannot remove and re-apply the ZIO XT patch monitor.   Your ZIO patch monitor will be sent USPS Priority mail from [facility] directly to your home address. The monitor may also be mailed to a PO BOX if home delivery is not available.   It may take 3-5 days to receive your monitor after you have been enrolled.   Once you have received you monitor, please review enclosed instructions.  Your monitor has already been registered assigning a specific monitor serial # to you.   Applying the monitor   Shave hair from upper left chest.   Hold abrader disc by orange tab.  Rub abrader in 40 strokes over left upper chest as indicated in your monitor instructions.   Clean area with 4 enclosed alcohol pads .  Use all pads to assure are is cleaned thoroughly.  Let dry.   Apply patch as indicated in monitor instructions.  Patch will be place under collarbone on left side of chest with arrow pointing upward.   Rub patch adhesive wings for 2 minutes.Remove white label marked "1".  Remove white label marked "2".  Rub patch adhesive wings for 2 additional minutes.   While looking in a mirror, press and release  button in center of patch.  A small green light will flash 3-4 times .  This will be your only indicator the monitor has been turned on.     Do not shower for the first 24 hours.  You may shower after the first 24 hours.   Press button if you feel a symptom. You will hear a small click.  Record Date, Time and Symptom in the Patient Log Book.   When you are ready to remove patch, follow instructions on last 2 pages of Patient Log Book.  Stick patch monitor onto last page of Patient Log Book.   Place Patient Log Book in [place] box.  Use locking tab on box and tape box closed securely.  The [employer] and [employer] has [employer] on it.  Please place in mailbox as soon as possible.  Your physician should have your test results approximately 7 days after the monitor has been mailed back to [facility].   Call [place] at [phone] if you have questions regarding your ZIO XT patch monitor.  Call them immediately if you see an orange light blinking on your monitor.   If your monitor falls off in less than 4 days contact our Monitor department at [phone].  If your monitor becomes loose or falls off after 4 days call [employer] at [phone] for suggestions on securing your monitor.

## 2019-09-03 NOTE — Telephone Encounter (Signed)
Enrolled patient for a 14 day Zio monitor to be mailed to patients home.  

## 2019-09-04 ENCOUNTER — Ambulatory Visit (HOSPITAL_COMMUNITY): Payer: No Typology Code available for payment source | Attending: Internal Medicine

## 2019-09-04 ENCOUNTER — Telehealth: Payer: Self-pay | Admitting: *Deleted

## 2019-09-04 DIAGNOSIS — I493 Ventricular premature depolarization: Secondary | ICD-10-CM | POA: Insufficient documentation

## 2019-09-04 DIAGNOSIS — R002 Palpitations: Secondary | ICD-10-CM | POA: Insufficient documentation

## 2019-09-04 DIAGNOSIS — I10 Essential (primary) hypertension: Secondary | ICD-10-CM

## 2019-09-04 LAB — BASIC METABOLIC PANEL
BUN/Creatinine Ratio: 20 (ref 9–23)
BUN: 20 mg/dL (ref 6–20)
CO2: 24 mmol/L (ref 20–29)
Calcium: 9.9 mg/dL (ref 8.7–10.2)
Chloride: 102 mmol/L (ref 96–106)
Creatinine, Ser: 1.02 mg/dL — ABNORMAL HIGH (ref 0.57–1.00)
GFR calc Af Amer: 83 mL/min/{1.73_m2} (ref >59)
GFR calc non Af Amer: 72 mL/min/{1.73_m2} (ref >59)
Glucose: 71 mg/dL (ref 65–99)
Potassium: 4.8 mmol/L (ref 3.5–5.2)
Sodium: 142 mmol/L (ref 134–144)

## 2019-09-04 MED ORDER — AMLODIPINE BESYLATE 5 MG PO TABS
5.0000 mg | ORAL_TABLET | Freq: Every day | ORAL | 6 refills | Status: DC
Start: 2019-09-04 — End: 2019-09-24

## 2019-09-04 NOTE — Telephone Encounter (Signed)
The patient sent in a patient advice request concerning her blood pressure. She was seen yesterday in the office where it was 170/110. Today when she got her echo completed it was 178/120.   She is very concerned about the elevated blood pressures and would like to know what can be done.  She is asymptomatic.   Message and secure chat sent to provider and nurse for recommendations.

## 2019-09-04 NOTE — Telephone Encounter (Signed)
Dr Margaretann Loveless spoke to patient in regards to patient 's blood pressure.   per Dr Margaretann Loveless - renal duplex ordered and start Amlodipine 5 mg daily. Dr Margaretann Loveless also gave preliminary results of echo to patient .    Dr Margaretann Loveless informed patient to take a recording of pressure daily . If b/p reach  [id number] - [id number] systolic recommend patient to go to  ER for  IV medication.   Per Dr Margaretann Loveless, patient voiced understading

## 2019-09-07 ENCOUNTER — Ambulatory Visit (INDEPENDENT_AMBULATORY_CARE_PROVIDER_SITE_OTHER): Payer: No Typology Code available for payment source

## 2019-09-07 DIAGNOSIS — I493 Ventricular premature depolarization: Secondary | ICD-10-CM

## 2019-09-07 DIAGNOSIS — R002 Palpitations: Secondary | ICD-10-CM | POA: Diagnosis not present

## 2019-09-15 ENCOUNTER — Other Ambulatory Visit: Payer: Self-pay

## 2019-09-15 ENCOUNTER — Ambulatory Visit (HOSPITAL_COMMUNITY)
Admission: RE | Admit: 2019-09-15 | Discharge: 2019-09-15 | Disposition: A | Discharge status: Home / Self Care | Payer: No Typology Code available for payment source | Source: Ambulatory Visit | Attending: Cardiology | Admitting: Cardiology

## 2019-09-15 DIAGNOSIS — I10 Essential (primary) hypertension: Secondary | ICD-10-CM | POA: Insufficient documentation

## 2019-09-18 LAB — ALDOSTERONE + RENIN ACTIVITY W/ RATIO
ALDOS/RENIN RATIO: 1.6 (ref 0.0–30.0)
ALDOSTERONE: 8.6 ng/dL (ref 0.0–30.0)
Renin: 5.355 ng/mL/hr (ref 0.167–5.380)

## 2019-09-24 MED ORDER — AMLODIPINE BESYLATE 5 MG PO TABS
10.0000 mg | ORAL_TABLET | Freq: Every day | ORAL | 1 refills | Status: DC
Start: 2019-09-24 — End: 2019-09-25

## 2019-09-24 NOTE — Telephone Encounter (Signed)
Order given from Dr Margaretann Loveless: schedule a virtual appt , refill medication amlodipine  RN Completed the above.  Message sent : Hello  Ms Kunzler  Dr Margaretann Loveless reviewed your last mychart message. She would like to have virtual visit with tomorrow morning Friday 09/25/19 @  9:40 -10 am.  The nurse would do the initial call and obtain your vitals go over your medication that you are taking now.  Dr Margaretann Loveless states wil can send in new Rx for amlodipine.  Will only send in a month worth . She may want to change a once day medication,  Thanks [employer]

## 2019-09-25 ENCOUNTER — Telehealth (INDEPENDENT_AMBULATORY_CARE_PROVIDER_SITE_OTHER): Payer: No Typology Code available for payment source | Admitting: Internal Medicine

## 2019-09-25 ENCOUNTER — Encounter: Payer: Self-pay | Admitting: Internal Medicine

## 2019-09-25 VITALS — BP 124/82 | HR 72 | Ht 65.0 in | Wt 128.0 lb

## 2019-09-25 DIAGNOSIS — R002 Palpitations: Secondary | ICD-10-CM | POA: Diagnosis not present

## 2019-09-25 DIAGNOSIS — I493 Ventricular premature depolarization: Secondary | ICD-10-CM

## 2019-09-25 DIAGNOSIS — E876 Hypokalemia: Secondary | ICD-10-CM | POA: Diagnosis not present

## 2019-09-25 DIAGNOSIS — I10 Essential (primary) hypertension: Secondary | ICD-10-CM

## 2019-09-25 MED ORDER — AMLODIPINE BESYLATE 5 MG PO TABS: 5.0000 mg | ORAL_TABLET | Freq: Two times a day (BID) | ORAL | 3 refills | Status: DC

## 2019-09-25 NOTE — Progress Notes (Signed)
Virtual Visit via Telephone Note   This visit type was conducted due to national recommendations for restrictions regarding the COVID-19 Pandemic (e.g. social distancing) in an effort to limit this patient's exposure and mitigate transmission in our community.  Due to her co-morbid illnesses, this patient is at least at moderate risk for complications without adequate follow up.  This format is felt to be most appropriate for this patient at this time.  The patient did not have access to video technology/had technical difficulties with video requiring transitioning to audio format only (telephone).  All issues noted in this document were discussed and addressed.  No physical exam could be performed with this format.  Please refer to the patient's chart for her  consent to telehealth for [facility].   The patient was identified using 2 identifiers.  Date:  09/25/2019   ID:  Vicki Rodriguez, DOB 01-04-1986, MRN [id number]  Patient Location: Home Provider Location: Office  PCP:  Patient, No Pcp Per  Cardiologist:  No primary care provider on file.  Electrophysiologist:  None   Evaluation Performed:  Follow-Up Visit  Chief Complaint:  F/u HTN and palpitations.  History of Present Illness:    Vicki Rodriguez is a 34 y.o. female with elevated blood pressure now felt to represent true hypertension and no other significant past medical history who presents today for follow up of palpitations and elevated blood pressure.   Her PVCs recurred for 1 hour and are still bothersome. Not currently on beta blockade. Monitor results pending.  Doing well on amlodipine, BP has improved, but still mildly elevated at times. Assessment for secondary causes of hypertension negative so far, no hyperaldosteronism noted and no renal artery stenosis or coarctation of the aorta.   The patient does not have symptoms concerning for COVID-19 infection (fever, chills, cough, or new shortness of breath).     Past Medical History:  Diagnosis Date  . GERD (gastroesophageal reflux disease)    pregnancy related  . 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 PONV (postoperative nausea and vomiting)   . Seasonal allergies    Past Surgical History:  Procedure Laterality Date  . CESAREAN SECTION  2014   x 1  . CESAREAN SECTION WITH BILATERAL TUBAL LIGATION Bilateral 07/07/2015     Current Meds  Medication Sig  . amLODipine (NORVASC) 5 MG tablet Take 5 mg by mouth in the morning and at bedtime.  . Multiple Vitamin (MULTIVITAMIN WITH MINERALS) TABS tablet Take 1 tablet by mouth daily.  . [DISCONTINUED] amLODipine (NORVASC) 5 MG tablet Take 2 tablets (10 mg total) by mouth daily. (Patient taking differently: Take 5 mg by mouth in the morning and at bedtime. )     Allergies:   Patient has no known allergies.   Social History   Tobacco Use  . Smoking status: Never Smoker  . Smokeless tobacco: Never Used  Substance Use Topics  . Alcohol use: No  . Drug use: No     Family Hx: The patient's family history includes Hypertension in her father.  ROS:   Please see the history of present illness.     All other systems reviewed and are negative.   Prior CV studies:   The following studies were reviewed today:    Labs/Other Tests and Data Reviewed:    EKG:  No ECG reviewed.  Recent Labs: 08/29/2019: Hemoglobin 13.7; Magnesium 2.1; Platelets 241 09/03/2019: BUN 20; Creatinine, Ser 1.02; Potassium 4.8; Sodium 142  Recent Lipid Panel Lab Results  Component Value Date/Time   CHOL 149 03/26/2017 09:20 AM   TRIG 45.0 03/26/2017 09:20 AM   HDL 42.30 03/26/2017 09:20 AM   CHOLHDL 4 03/26/2017 09:20 AM   LDLCALC 97 03/26/2017 09:20 AM    Wt Readings from Last 3 Encounters:  09/25/19 128 lb (58.1 kg)  09/03/19 130 lb 6.4 oz (59.1 kg)  07/06/15 161 lb (73 kg)     Objective:    Vital Signs:  BP 124/82   Pulse 72   Ht 5\' 5"  (1.651 m)   Wt 128 lb (58.1 kg)   LMP 08/29/2019 (Exact  Date)   BMI 21.30 kg/m    VITAL SIGNS:  reviewed GEN:  no acute distress RESPIRATORY:  normal respiratory effort, no increased work of breathing NEURO:  alert and oriented x 3, speech normal PSYCH:  normal affect   ASSESSMENT & PLAN:    1. Essential hypertension   2. PVC (premature ventricular contraction)   3. Palpitations   4. Hypokalemia    HTN - will increase dose of amlodipine to 10 mg daily and plan for appt in HTN clinic with Dr. Oval Linsey to assess further.   PVCs - monitor pending, defers addition of beta blockade at this time.   Hypokalemia - improved on repeat testing.   COVID-19 Education: The signs and symptoms of COVID-19 were discussed with the patient and how to seek care for testing (follow up with PCP or arrange E-visit).  The importance of social distancing was discussed today.  Time:   Today, I have spent 20 minutes with the patient with telehealth technology discussing the above problems.     Medication Adjustments/Labs and Tests Ordered: Current medicines are reviewed at length with the patient today.  Concerns regarding medicines are outlined above.   Tests Ordered: Orders Placed This Encounter  Procedures  . Basic metabolic panel  . AMB REFERRAL TO ADVANCED HTN CLINIC    Medication Changes: Meds ordered this encounter  Medications  . DISCONTD: amLODipine (NORVASC) 5 MG tablet    Sig: Take 1 tablet (5 mg total) by mouth in the morning and at bedtime.    Dispense:  60 tablet    Refill:  3    New dose, d/c previous rx    Follow Up:  With HTN clinic  Signed, Elouise Munroe, MD  09/25/2019 9:55 AM    [place]

## 2019-09-25 NOTE — Patient Instructions (Signed)
Medication Instructions:  Your physician recommends that you continue on your current medications as directed. Please refer to the Current Medication list given to you today.  *If you need a refill on your cardiac medications before your next appointment, please call your pharmacy*  Lab Work: BMET TODAY   If you have labs (blood work) drawn today and your tests are completely normal, you will receive your results only by: Marland Kitchen MyChart Message (if you have MyChart) OR . A paper copy in the mail If you have any lab test that is abnormal or we need to change your treatment, we will call you to review the results.   Testing/Procedures: NONE  Follow-Up: At [facility], you and your health needs are our priority.  As part of our continuing mission to provide you with exceptional heart care, we have created designated Provider Care Teams.  These Care Teams include your primary Cardiologist (physician) and Advanced Practice Providers (APPs -  Physician Assistants and Nurse Practitioners) who all work together to provide you with the care you need, when you need it.  We recommend signing up for the patient portal called "MyChart".  Sign up information is provided on this After Visit Summary.  MyChart is used to connect with patients for Virtual Visits (Telemedicine).  Patients are able to view lab/test results, encounter notes, upcoming appointments, etc.  Non-urgent messages can be sent to your provider as well.   To learn more about what you can do with MyChart, go to [email or web address].    Your next appointment:   10/08/2019 AT 10:30 WITH DR [place]way [facility]   12/01/2019 AT 8:00 AM WITH DR Margaretann Loveless

## 2019-09-26 LAB — BASIC METABOLIC PANEL
BUN/Creatinine Ratio: 15 (ref 9–23)
BUN: 15 mg/dL (ref 6–20)
CO2: 24 mmol/L (ref 20–29)
Calcium: 9.3 mg/dL (ref 8.7–10.2)
Chloride: 103 mmol/L (ref 96–106)
Creatinine, Ser: 0.97 mg/dL (ref 0.57–1.00)
GFR calc Af Amer: 88 mL/min/{1.73_m2} (ref >59)
GFR calc non Af Amer: 76 mL/min/{1.73_m2} (ref >59)
Glucose: 87 mg/dL (ref 65–99)
Potassium: 4.5 mmol/L (ref 3.5–5.2)
Sodium: 138 mmol/L (ref 134–144)

## 2019-09-28 MED ORDER — AMLODIPINE BESYLATE 5 MG PO TABS: 5.0000 mg | ORAL_TABLET | Freq: Two times a day (BID) | ORAL | 3 refills | Status: DC

## 2019-09-28 MED FILL — AMLODIPINE BESYLATE 5 MG TA: 5 | 30 days supply | Qty: 60 | Fill #0

## 2019-09-29 NOTE — Telephone Encounter (Signed)
Called the pt in response to her My Chart message. Pt reports the PVC's she is seeing on her Apple Watch have become more prevalent. She says they are happening more frequently and more often. 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 she has mild SOB when she has them at the highest point.   I have advised her that I will try to get a prelim of her Zio patch she wore recently.   I can make her an appt but Dr. Margaretann Loveless is our ogf the office this week.   She is not on anything for rate control will reach out to the DOD once I get her monitor results and ask if this may be a good option until she can talk with Dr. Margaretann Loveless next week.

## 2019-09-30 NOTE — Telephone Encounter (Signed)
Called pt to f/u on mychart message. Nurse offered pt an appointment for tomorrow 5/27 with Almyra Deforest, PA for further evaluations. Pt voiced she would prefer to see Dr. Kaylyn Layer when she is back in the office next week. Pt state she is getting kids dressed for school this morning but and call back later today to schedule an appointment with a scheduler.

## 2019-10-08 ENCOUNTER — Other Ambulatory Visit: Payer: Self-pay

## 2019-10-08 ENCOUNTER — Encounter: Payer: Self-pay | Admitting: Cardiovascular Disease

## 2019-10-08 ENCOUNTER — Ambulatory Visit (INDEPENDENT_AMBULATORY_CARE_PROVIDER_SITE_OTHER): Payer: No Typology Code available for payment source | Admitting: Cardiovascular Disease

## 2019-10-08 VITALS — BP 114/80 | HR 79 | Ht 65.0 in | Wt 129.0 lb

## 2019-10-08 DIAGNOSIS — I10 Essential (primary) hypertension: Secondary | ICD-10-CM | POA: Diagnosis not present

## 2019-10-08 DIAGNOSIS — I493 Ventricular premature depolarization: Secondary | ICD-10-CM

## 2019-10-08 HISTORY — DX: Ventricular premature depolarization: I49.3

## 2019-10-08 HISTORY — DX: Essential (primary) hypertension: I10

## 2019-10-08 LAB — TSH: TSH: 1.29 u[IU]/mL (ref 0.450–4.500)

## 2019-10-08 NOTE — Progress Notes (Signed)
Hypertension Clinic Initial Assessment:    Date:  10/08/2019   ID:  Vicki Rodriguez, DOB 07-18-1985, MRN [id number]  PCP:  Patient, No Pcp Per  Cardiologist:  No primary care provider on file.  Nephrologist:  Referring MD: Elouise Munroe, MD   CC: Hypertension  History of Present Illness:    Vicki Rodriguez is a 34 y.o. female with a hx of hypertension and PVCs here to establish care in the hypertension clinic.  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  She was working at night and had an episode of PVCs that lasted for couple hours.  She took an EKG that Showed them.  She was urged to go to the ED where she was found to be hypertensive to 161/100.  She was also hypokalemic.  Her potassium was repleted and she was evaluated by Dr. Margaretann Loveless on 4/21.  Her blood pressure at that visit was 170/110.  She had an echo that revealed LVEF 65 to 70% and was otherwise unremarkable.  Plasma renin and aldosterone were checked and were both within normal limits.  Renal artery Dopplers were normal.  She was started on amlodipine and referred to advanced hypertension clinic for further management.  Since starting amlodipine her blood pressure has been much better-controlled.  She notes that her palpitations seem to be more common just before her menstrual cycle.  She has not had as many issues with PVCs lately.  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  She was on a very low-dose of the medication for about a year but is unsure what it was.  Her blood pressure subsequently improved.  It became high again in 2018 when she had a Mirena implanted.  It was high for about 13 weeks.  The Mirena was removed and her blood pressure improved.  Vicki Rodriguez does not get much formal exercise.  She tries to exercise twice a week doing workout videos but this does not often happen.  She is active and chases her young children around.  She also gets a lot of steps in at work.  She has no exertional chest pain or  shortness of breath.  She does not snore.  She tries to limit her sodium intake.  She cut back to drinking 1 caffeinated beverage daily.  She does not drink alcohol or smoke.  She does not use any illicit drugs, NSAIDs, over-the-counter cold or cough medicines, or supplements other than occasional CBD oil.  Previous antihypertensives: Nifedipine   Past Medical History:  Diagnosis Date  . Essential hypertension 10/08/2019  . GERD (gastroesophageal reflux disease)    pregnancy related  . 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 PONV (postoperative nausea and vomiting)   . PVC (premature ventricular contraction) 10/08/2019  . Seasonal allergies     Past Surgical History:  Procedure Laterality Date  . CESAREAN SECTION  2014   x 1  . CESAREAN SECTION WITH BILATERAL TUBAL LIGATION Bilateral 07/07/2015    Current Medications: Current Meds  Medication Sig  . amLODipine (NORVASC) 5 MG tablet Take 1 tablet (5 mg total) by mouth in the morning and at bedtime.  . Multiple Vitamin (MULTIVITAMIN WITH MINERALS) TABS tablet Take 1 tablet by mouth daily.     Allergies:   Patient has no known allergies.   Social History   Socioeconomic History  . Marital status: Married    Spouse name: Not on file  . Number of  children: Not on file  . Years of education: Not on file  . Highest education level: Not on file  Occupational History  . Not on file  Tobacco Use  . Smoking status: Never Smoker  . Smokeless tobacco: Never Used  Substance and Sexual Activity  . Alcohol use: No  . Drug use: No  . Sexual activity: Yes  Other Topics Concern  . Not on file  Social History Narrative   Work or School: Marine scientist, Therapist, music at [employer], full-time, night shifts      Home Situation: Lives with husband and 2 daughters, age 67 and 45 months in November/2018      Spiritual Beliefs: Christian      Lifestyle: No regular exercise, healthy diet   Social Determinants of Health   Financial Resource Strain:   .  Difficulty of Paying Living Expenses:   Food Insecurity:   . Worried About Charity fundraiser in the Last Year:   . Arboriculturist in the Last Year:   Transportation Needs:   . Film/video editor (Medical):   Marland Kitchen Lack of Transportation (Non-Medical):   Physical Activity:   . Days of Exercise per Week:   . Minutes of Exercise per Session:   Stress:   . Feeling of Stress :   Social Connections:   . Frequency of Communication with Friends and Family:   . Frequency of Social Gatherings with Friends and Family:   . Attends Religious Services:   . Active Member of Clubs or Organizations:   . Attends Archivist Meetings:   Marland Kitchen Marital Status:      Family History: The patient's family history includes CAD in her father, paternal uncle, and paternal uncle; Heart attack (age of onset: 41) in her paternal grandfather; Hypertension in her father and paternal aunt.  ROS:   Please see the history of present illness.    All other systems reviewed and are negative.  EKGs/Labs/Other Studies Reviewed:    EKG:  EKG is not ordered today.    Recent Labs: 08/29/2019: Hemoglobin 13.7; Magnesium 2.1; Platelets 241 09/25/2019: BUN 15; Creatinine, Ser 0.97; Potassium 4.5; Sodium 138   Recent Lipid Panel    Component Value Date/Time   CHOL 149 03/26/2017 0920   TRIG 45.0 03/26/2017 0920   HDL 42.30 03/26/2017 0920   CHOLHDL 4 03/26/2017 0920   VLDL 9.0 03/26/2017 0920   LDLCALC 97 03/26/2017 0920   Echo 09/04/2019: 1. Left ventricular ejection fraction, by estimation, is 65 to 70%. The  left ventricle has normal function. The left ventricle has no regional  wall motion abnormalities. Left ventricular diastolic parameters were  normal.  2. Right ventricular systolic function is normal. The right ventricular  size is normal. There is normal pulmonary artery systolic pressure.  3. The mitral valve is normal in structure. No evidence of mitral valve  regurgitation.  4. The aortic  valve is tricuspid. Aortic valve regurgitation is not  visualized. Mild aortic valve sclerosis is present, with no evidence of  aortic valve stenosis.   Physical Exam:    VS:  BP 114/80   Pulse 79   Ht 5\' 5"  (1.651 m)   Wt 129 lb (58.5 kg)   SpO2 99%   BMI 21.47 kg/m  , BMI Body mass index is 21.47 kg/m. GENERAL:  Well appearing HEENT: Pupils equal round and reactive, fundi not visualized, oral mucosa unremarkable NECK:  No jugular venous distention, waveform within normal limits, carotid upstroke brisk  and symmetric, no bruits, no thyromegaly LYMPHATICS:  No cervical adenopathy LUNGS:  Clear to auscultation bilaterally HEART:  RRR.  PMI not displaced or sustained,S1 and S2 within normal limits, no S3, no S4, no clicks, no rubs, no murmurs ABD:  Flat, positive bowel sounds normal in frequency in pitch, no bruits, no rebound, no guarding, no midline pulsatile mass, no hepatomegaly, no splenomegaly EXT:  2 plus pulses throughout, no edema, no cyanosis no clubbing SKIN:  No rashes no nodules NEURO:  Cranial nerves II through XII grossly intact, motor grossly intact throughout PSYCH:  Cognitively intact, oriented to person place and time   ASSESSMENT:    1. Hypertension, unspecified type   2. Essential hypertension   3. PVC (premature ventricular contraction)     PLAN:    # Essential hypertension:  Vicki Rodriguez'  BP is much better controlled now on amlodipine.  We discussed trying to exercise a bit more.  She will continue limiting her sodium intake.  She has been checked for renal artery stenosis and hyperaldosteronism.  She does not have symptoms of OSA and is not using over-the-counter medications or supplements that could be contributing.  We will check her thyroid function today.  She is not interested in the [facility] program and does not need remote patient monitoring as her blood pressure is already controlled.   Disposition:    FU with MD/PharmD as needed.  Medication  Adjustments/Labs and Tests Ordered: Current medicines are reviewed at length with the patient today.  Concerns regarding medicines are outlined above.  Orders Placed This Encounter  Procedures  . TSH   No orders of the defined types were placed in this encounter.    Signed, Skeet Latch, MD  10/08/2019 11:17 AM    [place]

## 2019-10-08 NOTE — Patient Instructions (Signed)
Medication Instructions:  Your physician recommends that you continue on your current medications as directed. Please refer to the Current Medication list given to you today.   Labwork: TSH today in office   Testing/Procedures: NONE   Follow-Up: AS NEEDED with Dr. Oval Linsey (HTN clinic)    Special Instructions:    DASH Eating Plan DASH stands for "Dietary Approaches to Stop Hypertension." The DASH eating plan is a healthy eating plan that has been shown to reduce high blood pressure (hypertension). It may also reduce your risk for type 2 diabetes, heart disease, and stroke. The DASH eating plan may also help with weight loss. What are tips for following this plan?  General guidelines  Avoid eating more than 2,300 mg (milligrams) of salt (sodium) a day. If you have hypertension, you may need to reduce your sodium intake to 1,500 mg a day.  Limit alcohol intake to no more than 1 drink a day for nonpregnant women and 2 drinks a day for men. One drink equals 12 oz of beer, 5 oz of wine, or 1 oz of hard liquor.  Work with your health care provider to maintain a healthy body weight or to lose weight. Ask what an ideal weight is for you.  Get at least 30 minutes of exercise that causes your heart to beat faster (aerobic exercise) most days of the week. Activities may include walking, swimming, or biking.  Work with your health care provider or diet and nutrition specialist (dietitian) to adjust your eating plan to your individual calorie needs. Reading food labels   Check food labels for the amount of sodium per serving. Choose foods with less than 5 percent of the Daily Value of sodium. Generally, foods with less than 300 mg of sodium per serving fit into this eating plan.  To find whole grains, look for the word "whole" as the first word in the ingredient list. Shopping  Buy products labeled as "low-sodium" or "no salt added."  Buy fresh foods. Avoid canned foods and premade or  frozen meals. Cooking  Avoid adding salt when cooking. Use salt-free seasonings or herbs instead of table salt or sea salt. Check with your health care provider or pharmacist before using salt substitutes.  Do not fry foods. Cook foods using healthy methods such as baking, boiling, grilling, and broiling instead.  Cook with heart-healthy oils, such as olive, canola, soybean, or sunflower oil. Meal planning  Eat a balanced diet that includes: ? 5 or more servings of fruits and vegetables each day. At each meal, try to fill half of your plate with fruits and vegetables. ? Up to 6-8 servings of whole grains each day. ? Less than 6 oz of lean meat, poultry, or fish each day. A 3-oz serving of meat is about the same size as a deck of cards. One egg equals 1 oz. ? 2 servings of low-fat dairy each day. ? A serving of nuts, seeds, or beans 5 times each week. ? Heart-healthy fats. Healthy fats called Omega-3 fatty acids are found in foods such as flaxseeds and coldwater fish, like sardines, salmon, and mackerel.  Limit how much you eat of the following: ? Canned or prepackaged foods. ? Food that is high in trans fat, such as fried foods. ? Food that is high in saturated fat, such as fatty meat. ? Sweets, desserts, sugary drinks, and other foods with added sugar. ? Full-fat dairy products.  Do not salt foods before eating.  Try to eat at least  2 vegetarian meals each week.  Eat more home-cooked food and less restaurant, buffet, and fast food.  When eating at a restaurant, ask that your food be prepared with less salt or no salt, if possible. What foods are recommended? The items listed may not be a complete list. Talk with your dietitian about what dietary choices are best for you. Grains Whole-grain or whole-wheat bread. Whole-grain or whole-wheat pasta. Brown rice. [place]. Bulgur. Whole-grain and low-sodium cereals. Pita bread. Low-fat, low-sodium crackers. Whole-wheat flour  tortillas. Vegetables Fresh or frozen vegetables (raw, steamed, roasted, or grilled). Low-sodium or reduced-sodium tomato and vegetable juice. Low-sodium or reduced-sodium tomato sauce and tomato paste. Low-sodium or reduced-sodium canned vegetables. Fruits All fresh, dried, or frozen fruit. Canned fruit in natural juice (without added sugar). Meat and other protein foods Skinless chicken or [place]. Ground chicken or [place]. Pork with fat trimmed off. Fish and seafood. Egg whites. Dried beans, peas, or lentils. Unsalted nuts, nut butters, and seeds. Unsalted canned beans. Lean cuts of beef with fat trimmed off. Low-sodium, lean deli meat. Dairy Low-fat (1%) or fat-free (skim) milk. Fat-free, low-fat, or reduced-fat cheeses. Nonfat, low-sodium ricotta or cottage cheese. Low-fat or nonfat yogurt. Low-fat, low-sodium cheese. Fats and oils Soft margarine without trans fats. Vegetable oil. Low-fat, reduced-fat, or light mayonnaise and salad dressings (reduced-sodium). Canola, safflower, olive, soybean, and sunflower oils. Avocado. Seasoning and other foods Herbs. Spices. Seasoning mixes without salt. Unsalted popcorn and pretzels. Fat-free sweets. What foods are not recommended? The items listed may not be a complete list. Talk with your dietitian about what dietary choices are best for you. Grains Baked goods made with fat, such as croissants, muffins, or some breads. Dry pasta or rice meal packs. Vegetables Creamed or fried vegetables. Vegetables in a cheese sauce. Regular canned vegetables (not low-sodium or reduced-sodium). Regular canned tomato sauce and paste (not low-sodium or reduced-sodium). Regular tomato and vegetable juice (not low-sodium or reduced-sodium). Angie Fava. Olives. Fruits Canned fruit in a light or heavy syrup. Fried fruit. Fruit in cream or butter sauce. Meat and other protein foods Fatty cuts of meat. Ribs. Fried meat. Berniece Salines. Sausage. Bologna and other processed lunch meats.  Salami. Fatback. Hotdogs. Bratwurst. Salted nuts and seeds. Canned beans with added salt. Canned or smoked fish. Whole eggs or egg yolks. Chicken or [place] with skin. Dairy Whole or 2% milk, cream, and half-and-half. Whole or full-fat cream cheese. Whole-fat or sweetened yogurt. Full-fat cheese. Nondairy creamers. Whipped toppings. Processed cheese and cheese spreads. Fats and oils Butter. Stick margarine. Lard. Shortening. Ghee. Bacon fat. Tropical oils, such as coconut, palm kernel, or palm oil. Seasoning and other foods Salted popcorn and pretzels. Onion salt, garlic salt, seasoned salt, table salt, and sea salt. Worcestershire sauce. Tartar sauce. Barbecue sauce. Teriyaki sauce. Soy sauce, including reduced-sodium. Steak sauce. Canned and packaged gravies. Fish sauce. Oyster sauce. Cocktail sauce. Horseradish that you find on the shelf. Ketchup. Mustard. Meat flavorings and tenderizers. Bouillon cubes. Hot sauce and Tabasco sauce. Premade or packaged marinades. Premade or packaged taco seasonings. Relishes. Regular salad dressings. Where to find more information:  National Heart, Lung, and [place]: https://wilson-eaton.com/  American Heart Association: www.heart.org Summary  The DASH eating plan is a healthy eating plan that has been shown to reduce high blood pressure (hypertension). It may also reduce your risk for type 2 diabetes, heart disease, and stroke.  With the DASH eating plan, you should limit salt (sodium) intake to 2,300 mg a day. If you have hypertension, you may  need to reduce your sodium intake to 1,500 mg a day.  When on the DASH eating plan, aim to eat more fresh fruits and vegetables, whole grains, lean proteins, low-fat dairy, and heart-healthy fats.  Work with your health care provider or diet and nutrition specialist (dietitian) to adjust your eating plan to your individual calorie needs. This information is not intended to replace advice given to you by your health  care provider. Make sure you discuss any questions you have with your health care provider. Document Released: 04/12/2011 Document Revised: 04/05/2017 Document Reviewed: 04/16/2016 Elsevier Patient Education  2020 [employer].

## 2019-10-29 ENCOUNTER — Ambulatory Visit: Payer: No Typology Code available for payment source | Admitting: Internal Medicine

## 2019-11-06 MED FILL — AMLODIPINE BESYLATE 5 MG TA: 5 | 30 days supply | Qty: 60 | Fill #1

## 2019-12-01 ENCOUNTER — Ambulatory Visit (INDEPENDENT_AMBULATORY_CARE_PROVIDER_SITE_OTHER): Payer: No Typology Code available for payment source | Admitting: Internal Medicine

## 2019-12-01 ENCOUNTER — Other Ambulatory Visit: Payer: Self-pay

## 2019-12-01 VITALS — BP 126/88 | HR 82 | Ht 65.0 in | Wt 132.2 lb

## 2019-12-01 DIAGNOSIS — I493 Ventricular premature depolarization: Secondary | ICD-10-CM

## 2019-12-01 DIAGNOSIS — R002 Palpitations: Secondary | ICD-10-CM

## 2019-12-01 DIAGNOSIS — Z7189 Other specified counseling: Secondary | ICD-10-CM | POA: Diagnosis not present

## 2019-12-01 DIAGNOSIS — I10 Essential (primary) hypertension: Secondary | ICD-10-CM | POA: Diagnosis not present

## 2019-12-01 NOTE — Progress Notes (Signed)
Cardiology Office Note:    Date:  12/01/2019   ID:  Vicki Rodriguez, DOB 12-Dec-1985, MRN [id number]  PCP:  Patient, No Pcp Per  Cardiologist:  Elouise Munroe, MD  Electrophysiologist:  None   Referring MD: No ref. provider found  Chief Complaint: HTN  History of Present Illness:    Vicki Rodriguez is a 34 y.o. female with a history of hypertension and PVCs who presents for follow-up.  She is doing well on amlodipine 10 mg daily.  We again discussed the use of metoprolol for palpitations, which shared decision making she defers..  She has had no recurrent palpitations in June or July.  She feels that this is primarily related to the onset of her menses but has improved lately.  We discussed that this is likely palpitations related to increased LV pressure from hypertension and has improved with treatment of blood pressure.  We discussed monitor results which showed rare PACs and PVCs less than 1%, and symptoms primarily associated with infrequent PVCs.  Discussed visit with hypertension clinic and secondary work-up which has been unrevealing for secondary cause of hypertension.  Patient inquires about receiving the COVID-19 vaccination.  I recommend COVID-19 vaccination with high risk feature of hypertension.  Discussed in detail.  The patient denies chest pain, chest pressure, dyspnea at rest or with exertion, palpitations, PND, orthopnea, or leg swelling. Denies cough, fever, chills. Denies nausea, vomiting. Denies syncope or presyncope. Denies dizziness or lightheadedness.   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   Multiple Vitamin (MULTIVITAMIN WITH MINERALS) TABS tablet Take 1 tablet by mouth daily.     Allergies:   Patient has no known allergies.   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[employer], full-time, night shifts      Home Situation: Lives with husband and 2 daughters, age 11 and 22 months in November/2018      Spiritual Beliefs: Christian      Lifestyle: No regular exercise, healthy diet   Social Determinants of Health   Financial Resource Strain:    Difficulty of Paying Living Expenses:   Food Insecurity:    Worried About Charity fundraiser in the Last Year:    Ran Out of Food in the Last Year:   Transportation Needs:  Lack of Transportation (Medical):    Lack of Transportation (Non-Medical):   Physical Activity:    Days of Exercise per Week:    Minutes of Exercise per Session:   Stress:    Feeling of Stress :   Social Connections:    Frequency of Communication with Friends and Family:    Frequency of Social Gatherings with Friends and Family:    Attends Religious Services:    Active Member of Clubs or Organizations:    Attends Archivist Meetings:    Marital Status:      Family History: The patient's family history includes CAD in her father, paternal uncle,  and paternal uncle; Heart attack (age of onset: 4) in her paternal grandfather; Hypertension in her father and paternal aunt.  ROS:   Please see the history of present illness.    All other systems reviewed and are negative.  EKGs/Labs/Other Studies Reviewed:    The following studies were reviewed today:  EKG:  n/a  Recent Labs: 08/29/2019: Hemoglobin 13.7; Magnesium 2.1; Platelets 241 09/25/2019: BUN 15; Creatinine, Ser 0.97; Potassium 4.5; Sodium 138 10/08/2019: TSH 1.290  Recent Lipid Panel    Component Value Date/Time   CHOL 149 03/26/2017 0920   TRIG 45.0 03/26/2017 0920   HDL 42.30 03/26/2017 0920   CHOLHDL 4 03/26/2017 0920   VLDL 9.0 03/26/2017 0920   LDLCALC 97 03/26/2017 0920    Physical Exam:    VS:  BP (!) 126/88    Pulse 82    Ht 5\' 5"  (1.651 m)    Wt 132 lb 3.2 oz (60 kg)    SpO2 100%    BMI 22.00 kg/m     Wt Readings from Last 5 Encounters:  12/01/19 132 lb 3.2 oz (60 kg)  10/08/19 129 lb (58.5 kg)  09/25/19 128 lb (58.1 kg)  09/03/19 130 lb 6.4 oz (59.1 kg)  07/06/15 161 lb (73 kg)     Constitutional: No acute distress Eyes: sclera non-icteric, normal conjunctiva and lids ENMT: normal dentition, moist mucous membranes Cardiovascular: regular rhythm, normal rate, no murmurs. S1 and S2 normal. Radial pulses normal bilaterally. No jugular venous distention.  Respiratory: clear to auscultation bilaterally GI : normal bowel sounds, soft and nontender. No distention.   MSK: extremities warm, well perfused. No edema.  NEURO: grossly nonfocal exam, moves all extremities. PSYCH: alert and oriented x 3, normal mood and affect.   ASSESSMENT:    1. Essential hypertension   2. PVC (premature ventricular contraction)   3. Palpitations   4. Advice given about 2019 novel coronavirus infection    PLAN:    Hypertension-well-controlled on amlodipine 5 mg twice daily for a total of 10 mg daily.  Continue.  PVCs-improved after treatment of hypertension, she defers  addition of beta-blockade today which is reasonable.  Could consider addition of metoprolol if PVCs recur.  Monitor results discussed.  Patient inquires about whether she should receive covid 19 vaccination. She is a Press photographer. We discussed the risks and benefits of vaccination. I would recommend vaccination given risk factor of HTN. We discussed risks of vaccine include rare complications. Benefits of vaccination outweigh risks. She is considering.   Total time of encounter: 30 minutes total time of encounter, including 25 minutes spent in face-to-face patient care on the date of this encounter. This time includes coordination of care and counseling regarding above mentioned problem list. Remainder of non-face-to-face time involved reviewing chart documents/testing relevant to the patient encounter and documentation in  the medical record. I have independently reviewed documentation from referring provider.   Cherlynn Kaiser, MD [place]   [facility]    Medication Adjustments/Labs and Tests Ordered: Current medicines are reviewed at length with the patient today.  Concerns regarding medicines are outlined above.  No orders of the defined types were placed in this encounter.  No orders of the defined types were placed in this encounter.   Patient Instructions  Medication Instructions:  Your Physician recommend you continue on your current medication as directed.    *If you need a refill on your cardiac medications before your next appointment, please call your pharmacy*   Lab Work: None   Testing/Procedures: None   Follow-Up: At [facility], you and your health needs are our priority.  As part of our continuing mission to provide you with exceptional heart care, we have created designated Provider Care Teams.  These Care Teams include your primary Cardiologist (physician) and Advanced Practice Providers (APPs -  Physician Assistants and Nurse Practitioners) who all work  together to provide you with the care you need, when you need it.  We recommend signing up for the patient portal called "MyChart".  Sign up information is provided on this After Visit Summary.  MyChart is used to connect with patients for Virtual Visits (Telemedicine).  Patients are able to view lab/test results, encounter notes, upcoming appointments, etc.  Non-urgent messages can be sent to your provider as well.   To learn more about what you can do with MyChart, go to [email or web address].    Your next appointment:   6 month(s)  The format for your next appointment:   In Person  Provider:   Cherlynn Kaiser, MD

## 2019-12-01 NOTE — Patient Instructions (Signed)
Medication Instructions:  Your Physician recommend you continue on your current medication as directed.   *If you need a refill on your cardiac medications before your next appointment, please call your pharmacy*   Lab Work: None    Testing/Procedures: None   Follow-Up: At [facility], you and your health needs are our priority.  As part of our continuing mission to provide you with exceptional heart care, we have created designated Provider Care Teams.  These Care Teams include your primary Cardiologist (physician) and Advanced Practice Providers (APPs -  Physician Assistants and Nurse Practitioners) who all work together to provide you with the care you need, when you need it.  We recommend signing up for the patient portal called "MyChart".  Sign up information is provided on this After Visit Summary.  MyChart is used to connect with patients for Virtual Visits (Telemedicine).  Patients are able to view lab/test results, encounter notes, upcoming appointments, etc.  Non-urgent messages can be sent to your provider as well.   To learn more about what you can do with MyChart, go to [email or web address].    Your next appointment:   6 month(s)  The format for your next appointment:   In Person  Provider:   Gayatri Acharya, MD     

## 2019-12-10 MED FILL — AMLODIPINE BESYLATE 5 MG TA: 5 | 30 days supply | Qty: 60 | Fill #2

## 2020-01-19 MED FILL — AMLODIPINE BESYLATE 5 MG TA: 5 | 30 days supply | Qty: 60 | Fill #3

## 2020-02-16 ENCOUNTER — Telehealth: Payer: Self-pay | Admitting: *Deleted

## 2020-02-16 NOTE — Telephone Encounter (Signed)
A message was left, re: her follow up visit. 

## 2020-03-01 ENCOUNTER — Other Ambulatory Visit: Payer: Self-pay | Admitting: Internal Medicine

## 2020-03-09 ENCOUNTER — Other Ambulatory Visit: Payer: Self-pay | Admitting: Internal Medicine

## 2020-03-09 NOTE — Telephone Encounter (Signed)
   *  STAT* If patient is at the pharmacy, call can be transferred to refill team.   1. Which medications need to be refilled? (please list name of each medication and dose if known)   amLODipine (NORVASC) 5 MG tablet    2. Which pharmacy/location (including street and city if local pharmacy) is medication to be sent to? [place], [place]  3. Do they need a 30 day or 90 day supply? 90 days  Pt needs refill asap

## 2020-03-10 ENCOUNTER — Other Ambulatory Visit: Payer: Self-pay | Admitting: Internal Medicine

## 2020-03-10 MED ORDER — AMLODIPINE BESYLATE 5 MG PO TABS: 5.0000 mg | ORAL_TABLET | Freq: Two times a day (BID) | ORAL | 3 refills | Status: DC

## 2020-03-10 MED FILL — AMLODIPINE BESYLATE 5 MG TA: 5 | 30 days supply | Qty: 60 | Fill #0

## 2020-04-15 MED FILL — AMLODIPINE BESYLATE 5 MG TA: 5 | 30 days supply | Qty: 60 | Fill #1

## 2020-05-25 MED FILL — AMLODIPINE BESYLATE 5 MG TA: 5 | 30 days supply | Qty: 60 | Fill #2

## 2020-06-03 ENCOUNTER — Ambulatory Visit: Payer: No Typology Code available for payment source | Admitting: Internal Medicine

## 2020-07-06 MED FILL — AMLODIPINE BESYLATE 5 MG TA: 5 | 30 days supply | Qty: 60 | Fill #3

## 2020-07-18 ENCOUNTER — Other Ambulatory Visit: Payer: Self-pay | Admitting: Internal Medicine

## 2020-07-18 ENCOUNTER — Encounter: Payer: Self-pay | Admitting: Internal Medicine

## 2020-07-18 ENCOUNTER — Other Ambulatory Visit: Payer: Self-pay

## 2020-07-18 ENCOUNTER — Ambulatory Visit (INDEPENDENT_AMBULATORY_CARE_PROVIDER_SITE_OTHER): Payer: No Typology Code available for payment source | Admitting: Internal Medicine

## 2020-07-18 VITALS — BP 142/88 | HR 88 | Ht 65.0 in | Wt 135.0 lb

## 2020-07-18 DIAGNOSIS — R002 Palpitations: Secondary | ICD-10-CM

## 2020-07-18 DIAGNOSIS — I493 Ventricular premature depolarization: Secondary | ICD-10-CM

## 2020-07-18 DIAGNOSIS — I10 Essential (primary) hypertension: Secondary | ICD-10-CM | POA: Diagnosis not present

## 2020-07-18 MED ORDER — AMLODIPINE BESYLATE 5 MG PO TABS: 5.0000 mg | ORAL_TABLET | Freq: Two times a day (BID) | ORAL | 7 refills | Status: DC

## 2020-07-18 NOTE — Progress Notes (Signed)
Cardiology Office Note:    Date:  07/18/2020   ID:  Vicki Rodriguez, DOB 09/22/1985, MRN [id number]  PCP:  Patient, No Pcp Per  Cardiologist:  Elouise Munroe, MD  Electrophysiologist:  None   Referring MD: No ref. provider found   Chief Complaint/Reason for Referral: Hypertension, palpitations  History of Present Illness:    Vicki Rodriguez is a 35 y.o. female with a history of hypertension and PVCs who presents for follow-up.   She is doing quite well.  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  She stays busy at work with staffing issues but is doing well.  She had relatively stable blood pressure readings and therefore stopped checking them about a month ago.  I encouraged her to continue to check them at least once or twice a week given mildly elevated blood pressure reading on today's office visit.  She continues on amlodipine 5 mg twice daily, split dosing to avoid hypotension.  She previously presented with PVCs which were quite symptomatic but she feels these are coincident with her menstrual cycle, cardiac monitor was overall low risk with some symptomatic ectopy.  Deferred therapy given no worrisome findings.  The patient denies chest pain, chest pressure, dyspnea at rest or with exertion, palpitations, PND, orthopnea, or leg swelling. Denies cough, fever, chills. Denies nausea, vomiting. Denies syncope or presyncope. Denies dizziness or lightheadedness.    Past Medical History:  Diagnosis Date  . Essential hypertension 10/08/2019  . GERD (gastroesophageal reflux disease)    pregnancy related  . 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 PONV (postoperative nausea and vomiting)   . PVC (premature ventricular contraction) 10/08/2019  . Seasonal allergies     Past Surgical History:  Procedure Laterality Date  . CESAREAN SECTION  2014   x 1  . CESAREAN SECTION WITH BILATERAL TUBAL LIGATION Bilateral 07/07/2015    Current Medications: Current  Meds  Medication Sig  . amLODipine (NORVASC) 5 MG tablet Take 1 tablet (5 mg total) by mouth in the morning and at bedtime.  . Multiple Vitamin (MULTIVITAMIN WITH MINERALS) TABS tablet Take 1 tablet by mouth daily.     Allergies:   Patient has no known allergies.   Social History   Tobacco Use  . Smoking status: Never Smoker  . Smokeless tobacco: Never Used  Substance Use Topics  . Alcohol use: No  . Drug use: No     Family History: The patient's family history includes CAD in her father, paternal uncle, and paternal uncle; Heart attack (age of onset: 30) in her paternal grandfather; Hypertension in her father and paternal aunt.  ROS:   Please see the history of present illness.    All other systems reviewed and are negative.  EKGs/Labs/Other Studies Reviewed:    The following studies were reviewed today:  EKG:  NSR  Recent Labs: 08/29/2019: Hemoglobin 13.7; Magnesium 2.1; Platelets 241 09/25/2019: BUN 15; Creatinine, Ser 0.97; Potassium 4.5; Sodium 138 10/08/2019: TSH 1.290  Recent Lipid Panel    Component Value Date/Time   CHOL 149 03/26/2017 0920   TRIG 45.0 03/26/2017 0920   HDL 42.30 03/26/2017 0920   CHOLHDL 4 03/26/2017 0920   VLDL 9.0 03/26/2017 0920   LDLCALC 97 03/26/2017 0920    Physical Exam:    VS:  BP (!) 142/88   Pulse 88   Ht 5\' 5"  (1.651 m)   Wt 135 lb (61.2 kg)   SpO2 99%  BMI 22.47 kg/m     Wt Readings from Last 5 Encounters:  07/18/20 135 lb (61.2 kg)  12/01/19 132 lb 3.2 oz (60 kg)  10/08/19 129 lb (58.5 kg)  09/25/19 128 lb (58.1 kg)  09/03/19 130 lb 6.4 oz (59.1 kg)    Constitutional: No acute distress Eyes: sclera non-icteric, normal conjunctiva and lids ENMT: normal dentition, moist mucous membranes Cardiovascular: regular rhythm, normal rate, no murmurs. S1 and S2 normal. Radial pulses normal bilaterally. No jugular venous distention.  Respiratory: clear to auscultation bilaterally GI : normal bowel sounds, soft and nontender.  No distention.   MSK: extremities warm, well perfused. No edema.  NEURO: grossly nonfocal exam, moves all extremities. PSYCH: alert and oriented x 3, normal mood and affect.   ASSESSMENT:    1. Essential hypertension   2. PVC (premature ventricular contraction)   3. Palpitations    PLAN:    Essential hypertension - Plan: EKG 12-Lead -Continues on amlodipine 5 mg twice daily, dosed as such for avoidance of hypotension.  Blood pressure mildly elevated today, but she states it was previously very stable.  She will recheck her blood pressure several times a week and report back if blood pressure remains above 140/80.  PVC (premature ventricular contraction) Palpitations -Symptoms have improved without treatment.  Continue to encourage adequate hydration, moderate caffeine intake.  Reviewed monitor results again.  Total time of encounter: 20 minutes total time of encounter, including 15 minutes spent in face-to-face patient care on the date of this encounter. This time includes coordination of care and counseling regarding above mentioned problem list. Remainder of non-face-to-face time involved reviewing chart documents/testing relevant to the patient encounter and documentation in the medical record. I have independently reviewed documentation from referring provider.   Cherlynn Kaiser, MD, [facility]    Medication Adjustments/Labs and Tests Ordered: Current medicines are reviewed at length with the patient today.  Concerns regarding medicines are outlined above.   Orders Placed This Encounter  Procedures  . EKG 12-Lead    Meds ordered this encounter  Medications  . amLODipine (NORVASC) 5 MG tablet    Sig: Take 1 tablet (5 mg total) by mouth in the morning and at bedtime.    Dispense:  60 tablet    Refill:  7    Patient Instructions  Medication Instructions:  No Changes In Medications at this time.  *If you need a refill on your cardiac medications  before your next appointment, please call your pharmacy*  Follow-Up: At [facility], you and your health needs are our priority.  As part of our continuing mission to provide you with exceptional heart care, we have created designated Provider Care Teams.  These Care Teams include your primary Cardiologist (physician) and Advanced Practice Providers (APPs -  Physician Assistants and Nurse Practitioners) who all work together to provide you with the care you need, when you need it.  Your next appointment:   6 month(s)  The format for your next appointment:   In Person  Provider:   Cherlynn Kaiser, MD

## 2020-07-18 NOTE — Patient Instructions (Signed)

## 2020-08-15 ENCOUNTER — Other Ambulatory Visit: Payer: Self-pay | Admitting: *Deleted

## 2020-08-15 ENCOUNTER — Other Ambulatory Visit (HOSPITAL_COMMUNITY): Payer: Self-pay

## 2020-08-15 MED ORDER — METOPROLOL TARTRATE 25 MG PO TABS
25.0000 mg | ORAL_TABLET | ORAL | 1 refills | Status: DC | PRN
  Filled 2020-08-15: qty 30, 30d supply, fill #0

## 2020-08-15 MED FILL — Amlodipine Besylate Tab 5 MG (Base Equivalent): ORAL | 30 days supply | Qty: 60 | Fill #0 | Status: AC

## 2020-08-18 ENCOUNTER — Other Ambulatory Visit: Payer: Self-pay

## 2020-09-22 ENCOUNTER — Other Ambulatory Visit (HOSPITAL_COMMUNITY): Payer: Self-pay

## 2020-09-22 MED FILL — Amlodipine Besylate Tab 5 MG (Base Equivalent): ORAL | 30 days supply | Qty: 60 | Fill #1 | Status: AC

## 2020-09-23 ENCOUNTER — Other Ambulatory Visit (HOSPITAL_COMMUNITY): Payer: Self-pay

## 2020-10-27 ENCOUNTER — Other Ambulatory Visit (HOSPITAL_COMMUNITY): Payer: Self-pay

## 2020-10-27 MED FILL — Amlodipine Besylate Tab 5 MG (Base Equivalent): ORAL | 30 days supply | Qty: 60 | Fill #2 | Status: AC

## 2020-12-19 MED FILL — Amlodipine Besylate Tab 5 MG (Base Equivalent): ORAL | 30 days supply | Qty: 60 | Fill #3 | Status: AC

## 2020-12-20 ENCOUNTER — Other Ambulatory Visit (HOSPITAL_COMMUNITY): Payer: Self-pay

## 2021-02-07 ENCOUNTER — Other Ambulatory Visit (HOSPITAL_COMMUNITY): Payer: Self-pay

## 2021-02-07 MED FILL — Amlodipine Besylate Tab 5 MG (Base Equivalent): ORAL | 30 days supply | Qty: 60 | Fill #4 | Status: AC

## 2021-04-05 ENCOUNTER — Other Ambulatory Visit (HOSPITAL_COMMUNITY): Payer: Self-pay

## 2021-04-05 MED FILL — Amlodipine Besylate Tab 5 MG (Base Equivalent): ORAL | 30 days supply | Qty: 60 | Fill #5 | Status: AC

## 2021-06-16 ENCOUNTER — Other Ambulatory Visit (HOSPITAL_COMMUNITY): Payer: Self-pay

## 2021-06-16 MED FILL — Amlodipine Besylate Tab 5 MG (Base Equivalent): ORAL | 30 days supply | Qty: 60 | Fill #6 | Status: AC

## 2021-07-17 MED FILL — Amlodipine Besylate Tab 5 MG (Base Equivalent): ORAL | 30 days supply | Qty: 60 | Fill #7 | Status: AC

## 2021-07-18 ENCOUNTER — Other Ambulatory Visit (HOSPITAL_COMMUNITY): Payer: Self-pay

## 2021-09-22 ENCOUNTER — Telehealth: Payer: Self-pay | Admitting: Internal Medicine

## 2021-09-22 NOTE — Telephone Encounter (Signed)
*  STAT* If patient is at the pharmacy, call can be transferred to refill team.   1. Which medications need to be refilled? (please list name of each medication and dose if known) amLODipine (NORVASC) 5 MG tablet  2. Which pharmacy/location (including street and city if local pharmacy) is medication to be sent to? [facility]  3. Do they need a 30 day or 90 day supply? 90 day

## 2021-09-27 MED ORDER — AMLODIPINE BESYLATE 5 MG PO TABS
ORAL_TABLET | ORAL | 7 refills | Status: DC
  Filled 2021-10-03: qty 60, 30d supply, fill #0
  Filled 2021-11-28: qty 60, 30d supply, fill #1

## 2021-10-03 ENCOUNTER — Other Ambulatory Visit (HOSPITAL_COMMUNITY): Payer: Self-pay

## 2021-10-30 ENCOUNTER — Telehealth: Payer: Self-pay

## 2021-11-28 ENCOUNTER — Other Ambulatory Visit (HOSPITAL_COMMUNITY): Payer: Self-pay

## 2021-12-06 ENCOUNTER — Ambulatory Visit (INDEPENDENT_AMBULATORY_CARE_PROVIDER_SITE_OTHER): Payer: No Typology Code available for payment source | Admitting: Nurse Practitioner

## 2021-12-06 ENCOUNTER — Encounter: Payer: Self-pay | Admitting: Nurse Practitioner

## 2021-12-06 VITALS — BP 150/96 | HR 91 | Temp 97.3°F | Ht 65.0 in | Wt 137.0 lb

## 2021-12-06 DIAGNOSIS — Z0000 Encounter for general adult medical examination without abnormal findings: Secondary | ICD-10-CM

## 2021-12-06 DIAGNOSIS — Z6822 Body mass index (BMI) 22.0-22.9, adult: Secondary | ICD-10-CM
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
## 2021-12-06 NOTE — Patient Instructions (Signed)
We will call you with lab results Follow-up in 1 year or sooner if needed  Preventive Care 37-37 Years Old, Female Preventive care refers to lifestyle choices and visits with your health care provider that can promote health and wellness. Preventive care visits are also called wellness exams. What can I expect for my preventive care visit? Counseling During your preventive care visit, your health care provider may ask about your: Medical history, including: Past medical problems. Family medical history. Pregnancy history. Current health, including: Menstrual cycle. Method of birth control. Emotional well-being. Home life and relationship well-being. Sexual activity and sexual health. Lifestyle, including: Alcohol, nicotine or tobacco, and drug use. Access to firearms. Diet, exercise, and sleep habits. Work and work Statistician. Sunscreen use. Safety issues such as seatbelt and bike helmet use. Physical exam Your health care provider may check your: Height and weight. These may be used to calculate your BMI (body mass index). BMI is a measurement that tells if you are at a healthy weight. Waist circumference. This measures the distance around your waistline. This measurement also tells if you are at a healthy weight and may help predict your risk of certain diseases, such as type 2 diabetes and high blood pressure. Heart rate and blood pressure. Body temperature. Skin for abnormal spots. What immunizations do I need?  Vaccines are usually given at various ages, according to a schedule. Your health care provider will recommend vaccines for you based on your age, medical history, and lifestyle or other factors, such as travel or where you work. What tests do I need? Screening Your health care provider may recommend screening tests for certain conditions. This may include: Pelvic exam and Pap test. Lipid and cholesterol levels. Diabetes screening. This is done by checking your  blood sugar (glucose) after you have not eaten for a while (fasting). Hepatitis B test. Hepatitis C test. HIV (human immunodeficiency virus) test. STI (sexually transmitted infection) testing, if you are at risk. BRCA-related cancer screening. This may be done if you have a family history of breast, ovarian, tubal, or peritoneal cancers. Talk with your health care provider about your test results, treatment options, and if necessary, the need for more tests. Follow these instructions at home: Eating and drinking  Eat a healthy diet that includes fresh fruits and vegetables, whole grains, lean protein, and low-fat dairy products. Take vitamin and mineral supplements as recommended by your health care provider. Do not drink alcohol if: Your health care provider tells you not to drink. You are pregnant, may be pregnant, or are planning to become pregnant. If you drink alcohol: Limit how much you have to 0-1 drink a day. Know how much alcohol is in your drink. In the [place] one drink equals one 12 oz bottle of beer (355 mL), one 5 oz glass of wine (148 mL), or one 1 oz glass of hard liquor (44 mL). Lifestyle Brush your teeth every morning and night with fluoride toothpaste. Floss one time each day. Exercise for at least 30 minutes 5 or more days each week. Do not use any products that contain nicotine or tobacco. These products include cigarettes, chewing tobacco, and vaping devices, such as e-cigarettes. If you need help quitting, ask your health care provider. Do not use drugs. If you are sexually active, practice safe sex. Use a condom or other form of protection to prevent STIs. If you do not wish to become pregnant, use a form of birth control. If you plan to become pregnant, see your  health care provider for a prepregnancy visit. Find healthy ways to manage stress, such as: Meditation, yoga, or listening to music. Journaling. Talking to a trusted person. Spending time with friends and  family. Minimize exposure to UV radiation to reduce your risk of skin cancer. Safety Always wear your seat belt while driving or riding in a vehicle. Do not drive: If you have been drinking alcohol. Do not ride with someone who has been drinking. If you have been using any mind-altering substances or drugs. While texting. When you are tired or distracted. Wear a helmet and other protective equipment during sports activities. If you have firearms in your house, make sure you follow all gun safety procedures. Seek help if you have been physically or sexually abused. What's next? Go to your health care provider once a year for an annual wellness visit. Ask your health care provider how often you should have your eyes and teeth checked. Stay up to date on all vaccines. This information is not intended to replace advice given to you by your health care provider. Make sure you discuss any questions you have with your health care provider. Document Revised: 10/19/2020 Document Reviewed: 10/19/2020 Elsevier Patient Education  [place].

## 2021-12-07 LAB — COMPREHENSIVE METABOLIC PANEL
ALT: 10 IU/L (ref 0–32)
AST: 13 IU/L (ref 0–40)
Albumin/Globulin Ratio: 1.9 (ref 1.2–2.2)
Albumin: 4.8 g/dL (ref 3.9–4.9)
Alkaline Phosphatase: 64 IU/L (ref 44–121)
BUN/Creatinine Ratio: 16 (ref 9–23)
BUN: 16 mg/dL (ref 6–20)
Bilirubin Total: 0.3 mg/dL (ref 0.0–1.2)
CO2: 22 mmol/L (ref 20–29)
Calcium: 9.6 mg/dL (ref 8.7–10.2)
Chloride: 101 mmol/L (ref 96–106)
Creatinine, Ser: 0.97 mg/dL (ref 0.57–1.00)
Globulin, Total: 2.5 g/dL (ref 1.5–4.5)
Glucose: 90 mg/dL (ref 70–99)
Potassium: 4.5 mmol/L (ref 3.5–5.2)
Sodium: 140 mmol/L (ref 134–144)
Total Protein: 7.3 g/dL (ref 6.0–8.5)
eGFR: 78 mL/min/{1.73_m2} (ref >59)

## 2021-12-07 LAB — CBC WITH DIFFERENTIAL/PLATELET
Basophils Absolute: 0.1 10*3/uL (ref 0.0–0.2)
Basos: 1 %
EOS (ABSOLUTE): 0.2 10*3/uL (ref 0.0–0.4)
Eos: 3 %
Hematocrit: 43.6 % (ref 34.0–46.6)
Hemoglobin: 14.8 g/dL (ref 11.1–15.9)
Immature Grans (Abs): 0 10*3/uL (ref 0.0–0.1)
Immature Granulocytes: 0 %
Lymphocytes Absolute: 1.5 10*3/uL (ref 0.7–3.1)
Lymphs: 24 %
MCH: 29.5 pg (ref 26.6–33.0)
MCHC: 33.9 g/dL (ref 31.5–35.7)
MCV: 87 fL (ref 79–97)
Monocytes Absolute: 0.5 10*3/uL (ref 0.1–0.9)
Monocytes: 8 %
Neutrophils Absolute: 4 10*3/uL (ref 1.4–7.0)
Neutrophils: 64 %
Platelets: 257 10*3/uL (ref 150–450)
RBC: 5.01 x10E6/uL (ref 3.77–5.28)
RDW: 13.1 % (ref 11.7–15.4)
WBC: 6.3 10*3/uL (ref 3.4–10.8)

## 2021-12-07 LAB — LIPID PANEL
Chol/HDL Ratio: 3.5 ratio (ref 0.0–4.4)
Cholesterol, Total: 188 mg/dL (ref 100–199)
HDL: 53 mg/dL (ref >39)
LDL Chol Calc (NIH): 125 mg/dL — ABNORMAL HIGH (ref 0–99)
Triglycerides: 53 mg/dL (ref 0–149)
VLDL Cholesterol Cal: 10 mg/dL (ref 5–40)

## 2021-12-07 LAB — T4, FREE: Free T4: 1.4 ng/dL (ref 0.82–1.77)

## 2021-12-07 LAB — CARDIOVASCULAR RISK ASSESSMENT

## 2021-12-07 LAB — TSH: TSH: 1.47 u[IU]/mL (ref 0.450–4.500)

## 2022-01-05 ENCOUNTER — Other Ambulatory Visit (HOSPITAL_COMMUNITY): Payer: Self-pay

## 2022-01-05 ENCOUNTER — Encounter: Payer: Self-pay | Admitting: Internal Medicine

## 2022-01-05 ENCOUNTER — Ambulatory Visit: Payer: No Typology Code available for payment source | Attending: Internal Medicine | Admitting: Internal Medicine

## 2022-01-05 VITALS — BP 138/100 | HR 72 | Ht 65.0 in | Wt 138.8 lb

## 2022-01-05 DIAGNOSIS — R002 Palpitations: Secondary | ICD-10-CM | POA: Diagnosis not present

## 2022-01-05 DIAGNOSIS — Z79899 Other long term (current) drug therapy: Secondary | ICD-10-CM

## 2022-01-05 DIAGNOSIS — I10 Essential (primary) hypertension: Secondary | ICD-10-CM | POA: Diagnosis not present

## 2022-01-05 DIAGNOSIS — I493 Ventricular premature depolarization: Secondary | ICD-10-CM | POA: Diagnosis not present

## 2022-01-05 MED ORDER — AMLODIPINE BESYLATE 5 MG PO TABS
5.0000 mg | ORAL_TABLET | Freq: Two times a day (BID) | ORAL | 3 refills | Status: DC
  Filled 2022-01-05: qty 180, 90d supply, fill #0
  Filled 2022-05-18 – 2022-06-02 (×2): qty 180, 90d supply, fill #1
  Filled 2022-12-12: qty 180, 90d supply, fill #2

## 2022-01-05 NOTE — Patient Instructions (Signed)
Medication Instructions:  No Changes In Medications at this time.  *If you need a refill on your cardiac medications before your next appointment, please call your pharmacy*  Follow-Up: At [facility], you and your health needs are our priority.  As part of our continuing mission to provide you with exceptional heart care, we have created designated Provider Care Teams.  These Care Teams include your primary Cardiologist (physician) and Advanced Practice Providers (APPs -  Physician Assistants and Nurse Practitioners) who all work together to provide you with the care you need, when you need it.  Your next appointment:   1 year(s)  The format for your next appointment:   In Person  Provider:   Gayatri A Acharya, MD          
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
## 2022-01-19 IMAGING — DX DG CHEST 2V
2 series · 2 of 2 positions shown · non-contrast
Comparison: None.

CLINICAL DATA: Initial evaluation for acute palpitations, shortness
of breath.

EXAM:
CHEST - 2 VIEW

[chest pa]
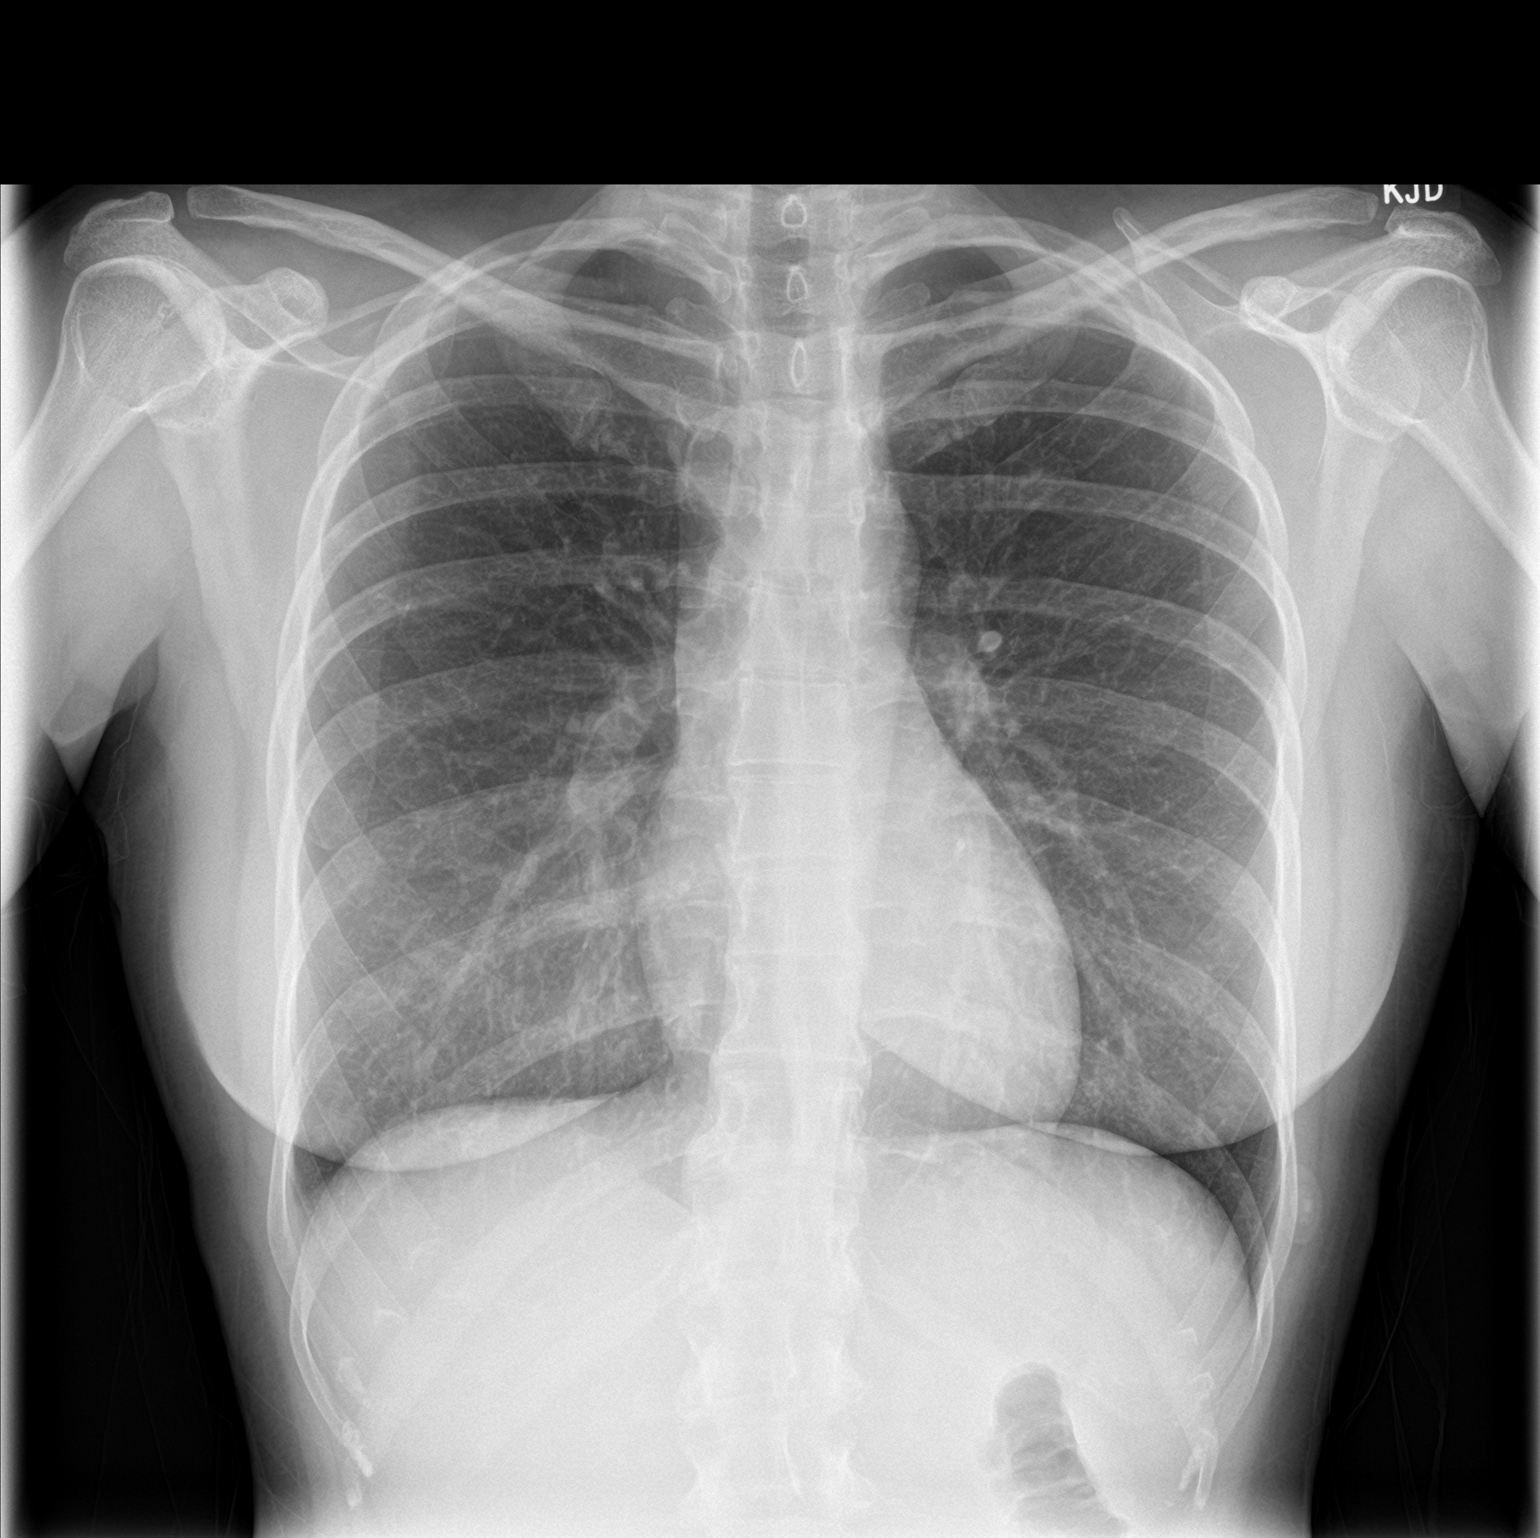

[chest lat]
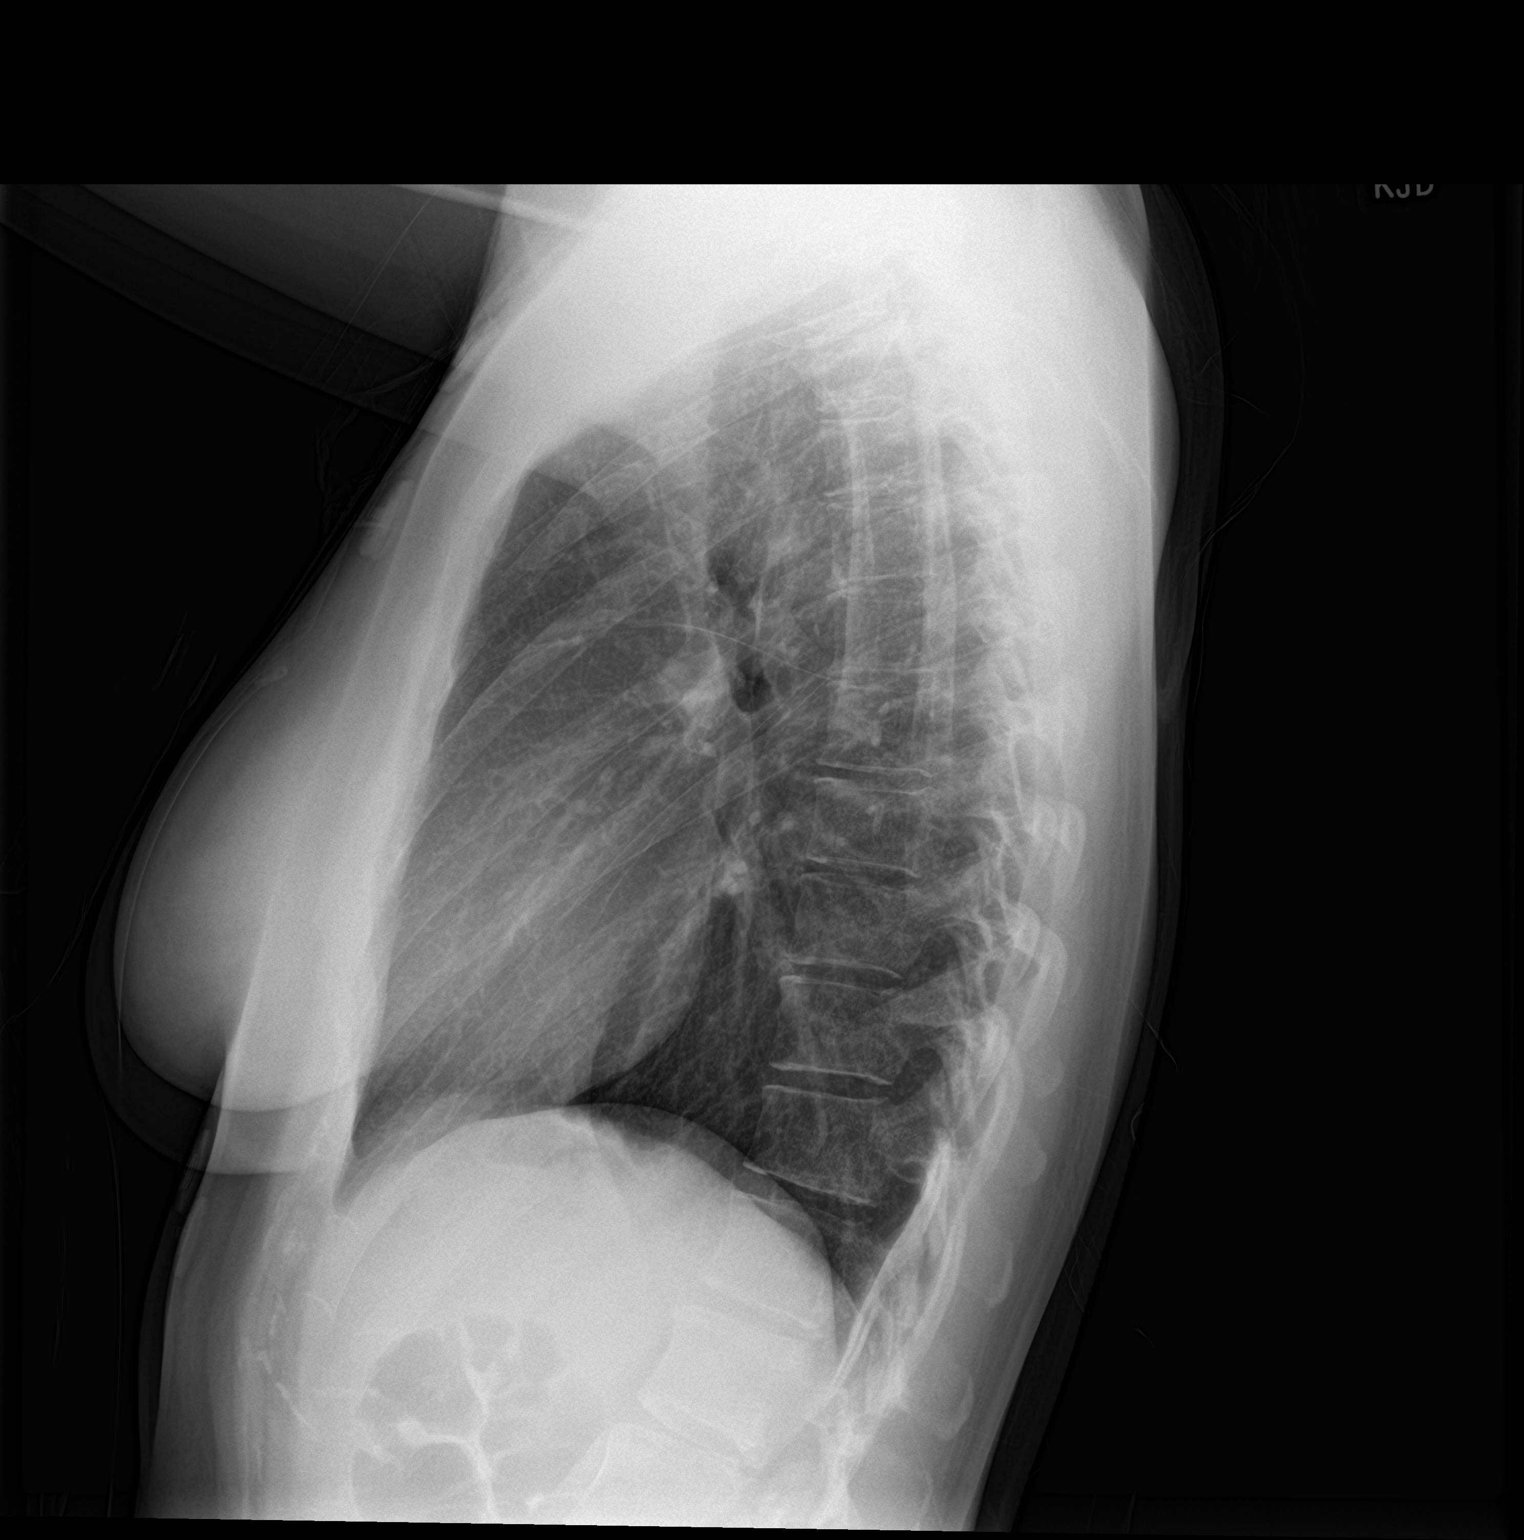

[2 of 2 positions shown; findings below may reference images not displayed]

FINDINGS: The cardiac and mediastinal silhouettes are within normal limits.

The lungs are normally inflated. No airspace consolidation, pleural
effusion, or pulmonary edema. No pneumothorax.

No acute osseous abnormality.
IMPRESSION: No active cardiopulmonary disease.
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
## 2022-05-29 ENCOUNTER — Other Ambulatory Visit (HOSPITAL_COMMUNITY): Payer: Self-pay

## 2022-11-29 ENCOUNTER — Encounter: Payer: Self-pay | Admitting: Internal Medicine

## 2022-11-29 ENCOUNTER — Telehealth: Payer: Self-pay | Admitting: Internal Medicine

## 2022-11-29 NOTE — Telephone Encounter (Signed)
Patient states she is out of town and will not be able to come tomorrow because she is out of town. She would like to know if you can fit her in another day.

## 2022-11-29 NOTE — Telephone Encounter (Signed)
Left message for pt to call back  °

## 2022-11-29 NOTE — Telephone Encounter (Signed)
Patient calling in regards to a mychart message offering an appointment for tomorrow with Dr. Jacques Navy. She says she is out of town that day and I did not see anything else until November and she needs to be seen sooner. She states she is supposed to see Dr. Jacques Navy and not an APP. Please advise.

## 2022-11-30 NOTE — Telephone Encounter (Signed)
Spoke with pt regarding an office visit with Dr. Jacques Navy to discuss elevated blood pressure over the last few months. Office visit scheduled. Pt will continue to monitor her blood pressure and keep a log for Dr. Jacques Navy to review at office visit. Pt verbalizes understanding.

## 2022-11-30 NOTE — Telephone Encounter (Signed)
See additional phone encounter for more details. Closing this encounter.

## 2022-12-11 ENCOUNTER — Telehealth: Payer: Self-pay

## 2022-12-11 NOTE — Telephone Encounter (Signed)
Left message for patient to call back if she would like to move her appointment up tomorrow.

## 2022-12-12 ENCOUNTER — Encounter: Payer: Self-pay | Admitting: Internal Medicine

## 2022-12-12 ENCOUNTER — Other Ambulatory Visit (HOSPITAL_COMMUNITY): Payer: Self-pay

## 2022-12-12 ENCOUNTER — Ambulatory Visit: Payer: 59 | Attending: Internal Medicine | Admitting: Internal Medicine

## 2022-12-12 VITALS — BP 118/90 | HR 72 | Ht 65.0 in | Wt 140.2 lb

## 2022-12-12 DIAGNOSIS — I493 Ventricular premature depolarization: Secondary | ICD-10-CM | POA: Diagnosis not present

## 2022-12-12 DIAGNOSIS — I10 Essential (primary) hypertension: Secondary | ICD-10-CM

## 2022-12-12 DIAGNOSIS — R002 Palpitations: Secondary | ICD-10-CM

## 2022-12-12 DIAGNOSIS — Z79899 Other long term (current) drug therapy: Secondary | ICD-10-CM | POA: Diagnosis not present

## 2022-12-12 DIAGNOSIS — E7800 Pure hypercholesterolemia, unspecified: Secondary | ICD-10-CM

## 2022-12-12 MED ORDER — LOSARTAN POTASSIUM 25 MG PO TABS
25.0000 mg | ORAL_TABLET | Freq: Every day | ORAL | 3 refills | Status: DC
Start: 2022-12-12 — End: 2024-03-05
  Filled 2022-12-12: qty 90, 90d supply, fill #0
  Filled 2023-04-02: qty 90, 90d supply, fill #1
  Filled 2023-08-12: qty 90, 90d supply, fill #2
  Filled 2023-12-03: qty 90, 90d supply, fill #3

## 2022-12-12 NOTE — Patient Instructions (Signed)
Medication Instructions:  Your physician has recommended you make the following change in your medication:   -Start losartan (cozaar) 25mg  once daily.  *If you need a refill on your cardiac medications before your next appointment, please call your pharmacy*   Lab Work: Your physician recommends that you return for lab work in: 7-10 days BMET  If you have labs (blood work) drawn today and your tests are completely normal, you will receive your results only by: MyChart Message (if you have MyChart) OR A paper copy in the mail If you have any lab test that is abnormal or we need to change your treatment, we will call you to review the results.   Testing/Procedures: Your physician has requested that you have a upper extremity arterial duplex. This test is an ultrasound of the arteries in the arms. It looks at arterial blood flow in the arms. Allow one hour for Upper Arterial scans. There are no restrictions or special instructions. This will take place at [address] [facility]    Follow-Up: At [facility], you and your health needs are our priority.  As part of our continuing mission to provide you with exceptional heart care, we have created designated Provider Care Teams.  These Care Teams include your primary Cardiologist (physician) and Advanced Practice Providers (APPs -  Physician Assistants and Nurse Practitioners) who all work together to provide you with the care you need, when you need it.  We recommend signing up for the patient portal called "MyChart".  Sign up information is provided on this After Visit Summary.  MyChart is used to connect with patients for Virtual Visits (Telemedicine).  Patients are able to view lab/test results, encounter notes, upcoming appointments, etc.  Non-urgent messages can be sent to your provider as well.   To learn more about what you can do with MyChart, go to [email or web address].    Your next appointment:   6-8  week(s)  Provider:   Gillian Shields, NP
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
## 2022-12-13 ENCOUNTER — Other Ambulatory Visit: Payer: Self-pay

## 2022-12-18 ENCOUNTER — Other Ambulatory Visit: Payer: Self-pay | Admitting: Internal Medicine

## 2022-12-18 DIAGNOSIS — I10 Essential (primary) hypertension: Secondary | ICD-10-CM

## 2022-12-18 DIAGNOSIS — I998 Other disorder of circulatory system: Secondary | ICD-10-CM

## 2022-12-18 DIAGNOSIS — E7800 Pure hypercholesterolemia, unspecified: Secondary | ICD-10-CM

## 2022-12-18 DIAGNOSIS — Z79899 Other long term (current) drug therapy: Secondary | ICD-10-CM

## 2022-12-18 DIAGNOSIS — R002 Palpitations: Secondary | ICD-10-CM

## 2022-12-18 DIAGNOSIS — I493 Ventricular premature depolarization: Secondary | ICD-10-CM

## 2022-12-19 ENCOUNTER — Ambulatory Visit (HOSPITAL_COMMUNITY)
Admission: RE | Admit: 2022-12-19 | Discharge: 2022-12-19 | Disposition: A | Discharge status: Home / Self Care | Payer: 59 | Source: Ambulatory Visit | Attending: Cardiovascular Disease | Admitting: Cardiovascular Disease

## 2022-12-19 DIAGNOSIS — I998 Other disorder of circulatory system: Secondary | ICD-10-CM | POA: Diagnosis not present

## 2022-12-21 DIAGNOSIS — E7800 Pure hypercholesterolemia, unspecified: Secondary | ICD-10-CM | POA: Diagnosis not present

## 2022-12-21 DIAGNOSIS — I493 Ventricular premature depolarization: Secondary | ICD-10-CM | POA: Diagnosis not present

## 2022-12-21 DIAGNOSIS — I10 Essential (primary) hypertension: Secondary | ICD-10-CM | POA: Diagnosis not present

## 2022-12-21 DIAGNOSIS — Z79899 Other long term (current) drug therapy: Secondary | ICD-10-CM | POA: Diagnosis not present

## 2022-12-21 LAB — BASIC METABOLIC PANEL: Creatinine, Ser: 0.91 mg/dL (ref 0.57–1.00)

## 2023-02-05 ENCOUNTER — Ambulatory Visit (HOSPITAL_BASED_OUTPATIENT_CLINIC_OR_DEPARTMENT_OTHER): Payer: 59 | Admitting: Family

## 2023-02-05 ENCOUNTER — Other Ambulatory Visit (HOSPITAL_COMMUNITY): Payer: Self-pay

## 2023-02-05 ENCOUNTER — Encounter (HOSPITAL_BASED_OUTPATIENT_CLINIC_OR_DEPARTMENT_OTHER): Payer: Self-pay | Admitting: Family

## 2023-02-05 VITALS — BP 104/66 | HR 78 | Ht 65.0 in | Wt 143.4 lb

## 2023-02-05 DIAGNOSIS — E782 Mixed hyperlipidemia: Secondary | ICD-10-CM

## 2023-02-05 DIAGNOSIS — Z8249 Family history of ischemic heart disease and other diseases of the circulatory system: Secondary | ICD-10-CM | POA: Diagnosis not present

## 2023-02-05 DIAGNOSIS — I10 Essential (primary) hypertension: Secondary | ICD-10-CM | POA: Diagnosis not present

## 2023-02-05 DIAGNOSIS — I493 Ventricular premature depolarization: Secondary | ICD-10-CM | POA: Diagnosis not present

## 2023-02-05 MED ORDER — AMLODIPINE BESYLATE 5 MG PO TABS
5.0000 mg | ORAL_TABLET | Freq: Two times a day (BID) | ORAL | 3 refills | Status: DC
  Filled 2023-02-05 – 2023-05-15 (×2): qty 180, 90d supply, fill #0
  Filled 2023-10-18: qty 180, 90d supply, fill #1

## 2023-02-05 NOTE — Patient Instructions (Signed)
Medication Instructions:  Continue your current medications *If you need a refill on your cardiac medications before your next appointment, please call your pharmacy*  Lab Work: Recommend checking Lipoprotein (a) - we will reach out to Dr. Sedalia Muta to inquire if we can check at your physical Monday  Follow-Up: At [facility], you and your health needs are our priority.  As part of our continuing mission to provide you with exceptional heart care, we have created designated Provider Care Teams.  These Care Teams include your primary Cardiologist (physician) and Advanced Practice Providers (APPs -  Physician Assistants and Nurse Practitioners) who all work together to provide you with the care you need, when you need it.  We recommend signing up for the patient portal called "MyChart".  Sign up information is provided on this After Visit Summary.  MyChart is used to connect with patients for Virtual Visits (Telemedicine).  Patients are able to view lab/test results, encounter notes, upcoming appointments, etc.  Non-urgent messages can be sent to your provider as well.   To learn more about what you can do with MyChart, go to [email or web address].    Your next appointment:   6 month(s)  Provider:   Parke Poisson, MD  or Alver Sorrow, NP    Other Instructions   Tips to Measure your Blood Pressure Correctly  Here's what you can do to ensure a correct reading:  Don't drink a caffeinated beverage or smoke during the 30 minutes before the test.  Sit quietly for five minutes before the test begins.  During the measurement, sit in a chair with your feet on the floor and your arm supported so your elbow is at about heart level.  The inflatable part of the cuff should completely cover at least 80% of your upper arm, and the cuff should be placed on bare skin, not over a shirt.  Don't talk during the measurement.   Blood pressure categories  Blood pressure category SYSTOLIC (upper  number)  DIASTOLIC (lower number)  Normal Less than 120 mm Hg and Less than 80 mm Hg  Elevated 120-129 mm Hg and Less than 80 mm Hg  High blood pressure: Stage 1 hypertension 130-139 mm Hg or 80-89 mm Hg  High blood pressure: Stage 2 hypertension 140 mm Hg or higher or 90 mm Hg or higher  Hypertensive crisis (consult your doctor immediately) Higher than 180 mm Hg and/or Higher than 120 mm Hg  Source: American Heart Association and American Stroke Association. For more on getting your blood pressure under control, buy Controlling Your Blood Pressure, a Special Health Report from [facility].   Blood Pressure Log   Date   Time  Blood Pressure  Example: Nov 1 9 AM 124/78

## 2023-02-05 NOTE — Progress Notes (Signed)
Advanced Hypertension Clinic Initial Assessment:    Date:  02/05/2023   ID:  Vicki Rodriguez, DOB Oct 03, 1985, MRN [id number]  PCP:  Blane Ohara, MD  Cardiologist:  Parke Poisson, MD  Nephrologist:  Referring MD: No ref. provider found   CC: Hypertension  History of Present Illness:    Vicki Rodriguez is a 37 y.o. female with a hx of hypertension, PVC here to establish care in the Advanced Hypertension Clinic.   Echo 08/2019 normal LVEF 65 to 70%, no RWMA, RV normal, no significant valvular abnormalities. Prior renal artery duplex 09/2019 with no renal artery stenosis.  09/2019 normal renin-aldosterone ratio. 07/2020 started metoprolol tartrate as needed for palpitations and PVC. Later stopped as she was not needing. Labs 12/2021 normal thyroid function.  Seen 12/12/2022 by Dr. Jacques Navy with blood pressure increased over the past few months spine amlodipine 5 mg twice daily.  Losartan 25 mg daily was initiated.  Upper extremity duplex performed 12/19/2022 due to asymmetric blood pressure with no significant arterial obstruction in bilateral upper extremity.  Presents today independently. Vicki Rodriguez was diagnosed with hypertension in 2021. Blood pressure checked with arm cuff at home. Readings have been 118/70s. she reports tobacco use never. Alcohol use rarely. 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 she eats at home and outside of the home and does follow low sodium diet. No snoring wakes feeling well rested, no indication for sleep study.   Previous antihypertensives:   Past Medical History:  Diagnosis Date   Essential hypertension 10/08/2019   GERD (gastroesophageal reflux disease)    pregnancy related   Hypertension    while in nursing school   PONV (postoperative nausea and vomiting)    PVC (premature ventricular contraction) 10/08/2019   Seasonal allergies     Past  Surgical History:  Procedure Laterality Date   CESAREAN SECTION  2014   x 1   CESAREAN SECTION WITH BILATERAL TUBAL LIGATION Bilateral 07/07/2015    Current Medications: Current Meds  Medication Sig   losartan (COZAAR) 25 MG tablet Take 1 tablet (25 mg total) by mouth daily.   Multiple Vitamin (MULTIVITAMIN WITH MINERALS) TABS tablet Take 1 tablet by mouth daily.   [DISCONTINUED] amLODipine (NORVASC) 5 MG tablet Take 1 tablet (5 mg total) by mouth in the morning and at bedtime.     Allergies:   Patient has no known allergies.   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[employer], full-time, night shifts      Home Situation: Lives with husband and 2 daughters, age 83 and 20 months in November/2018      Spiritual Beliefs: Christian      Lifestyle: No regular exercise, healthy diet   Social Determinants of Health   Financial Resource Strain: Low Risk  (12/06/2021)   Overall Financial Resource Strain (CARDIA)  Difficulty of Paying Living Expenses: Not hard at all  Food Insecurity: No Food Insecurity (12/06/2021)   Hunger Vital Sign    Worried About Running Out of Food in the Last Year: Never true    Ran Out of Food in the Last Year: Never true  Transportation Needs: No Transportation Needs (12/06/2021)   PRAPARE - Administrator, Civil Service (Medical): No    Lack of Transportation (Non-Medical): No  Physical Activity: Insufficiently Active (12/06/2021)   Exercise Vital Sign    Days of Exercise per Week: 3 days    Minutes of Exercise per  Session: 30 min  Stress: No Stress Concern Present (12/06/2021)   [employer] of Occupational Health - Occupational Stress Questionnaire    Feeling of Stress : Not at all  Social Connections: Moderately Integrated (12/06/2021)   Social Connection and Isolation Panel [NHANES]    Frequency of Communication with Friends and Family: More than three times a week    Frequency of Social Gatherings with Friends and Family: More than three times a week    Attends Religious Services: More than 4 times per year    Active Member of [employer] or Organizations: No    Attends Banker Meetings: Never    Marital Status: Married     Family History: The patient's family history includes CAD in her father, paternal uncle, and paternal uncle; Heart attack (age of onset: 49) in her paternal grandfather; Hypertension in her father and paternal aunt.  ROS:   Please see the history of present illness.     All other systems reviewed and are negative.  EKGs/Labs/Other Studies Reviewed:         Recent Labs: 12/21/2022: BUN 18; Creatinine, Ser 0.91; Potassium 4.4; Sodium 138   Recent Lipid Panel    Component Value Date/Time   CHOL 188 12/06/2021 1019   TRIG 53 12/06/2021 1019   HDL 53 12/06/2021 1019   CHOLHDL 3.5 12/06/2021 1019   CHOLHDL 4 03/26/2017 0920   VLDL 9.0 03/26/2017 0920   LDLCALC 125 (H) 12/06/2021 1019    Physical Exam:   VS:  BP 104/66   Pulse 78   Ht 5\' 5"  (1.651 m)   Wt 143 lb 6.4 oz (65 kg)   LMP  (LMP Unknown)   SpO2 98%   BMI 23.86 kg/m  , BMI Body mass index is 23.86 kg/m. GENERAL:  Well appearing HEENT: Pupils equal round and reactive, fundi not visualized, oral mucosa unremarkable NECK:  No jugular venous distention, waveform within normal limits, carotid upstroke brisk and symmetric, no bruits, no thyromegaly LYMPHATICS:  No cervical adenopathy LUNGS:  Clear to auscultation bilaterally HEART:  RRR.  PMI not displaced or sustained,S1 and S2 within normal  limits, no S3, no S4, no clicks, no rubs, no murmurs ABD:  Flat, positive bowel sounds normal in frequency in pitch, no bruits, no rebound, no guarding, no midline pulsatile mass, no hepatomegaly, no splenomegaly EXT:  2 plus pulses throughout, no edema, no cyanosis no clubbing SKIN:  No rashes no nodules NEURO:  Cranial nerves II through XII grossly intact, motor grossly intact throughout PSYCH:  Cognitively intact, oriented to person place and time   ASSESSMENT/PLAN:    HTN - BP well controlled. Continue current antihypertensive regimen Amlodipine 5mg  BID, Losartan 25mg  daily. Refill provided. Secondary workup, as below. As BP controlled on 2 agents, low suspicion for pheochromocytoma but if BP persistently difficult to control in the future plasma metanephrines and  catecholamines could be considered.  Recommend aiming for 150 minutes of moderate intensity activity per week and following a heart healthy diet.    HLD -12/24/2021 total cholesterol 188, LDL 125, triglycerides 58, HDL 53.  Physical with PCP upcoming 02/11/2023. Anticipate lipids will be rechecked at that time. Will inquire if PCP can collect Lipoprotein a. If Lp(a) elevated, plan to initiate statin therapy. If Lp(a) normal but LDL remains elevated, consider coronary calcium score for further risk stratification given strong family history of cardiovascular disease.  PVC -monitor 10/2019 but only normal sinus rhythm, PVC burden less than 1%.  Previously on metoprolol titrate as needed but was stopped as she was not needing.  Screening for Secondary Hypertension:     02/05/2023    9:00 AM  Causes  Drugs/Herbals Screened     - Comments none noted  Renovascular HTN Screened     - Comments normal renal duplex  Sleep Apnea N/A     - Comments no snoring, no daytime somnolence  Thyroid Disease Screened     - Comments 12/2021 normal TSH  Hyperaldosteronism Screened     - Comments 2021 normal renin-aldosterone  Pheochromocytoma Not  Screened  Cushing's Syndrome N/A     - Comments Non cushingoid appearance  Coarctation of the Aorta Screened     - Comments Normal echo and upper extremity duplex  Compliance Screened     - Comments Taking medication regularly    Relevant Labs/Studies:    Latest Ref Rng & Units 12/21/2022    8:17 AM 12/06/2021   10:19 AM 09/25/2019   12:20 PM  Basic Labs  Sodium 134 - 144 mmol/L 138  140  138   Potassium 3.5 - 5.2 mmol/L 4.4  4.5  4.5   Creatinine 0.57 - 1.00 mg/dL 1.32  4.40  1.02        Latest Ref Rng & Units 12/06/2021   10:19 AM 10/08/2019   11:07 AM  Thyroid   TSH 0.450 - 4.500 uIU/mL 1.470  1.290        Latest Ref Rng & Units 09/15/2019   10:38 AM  Renin/Aldosterone   Aldosterone 0.0 - 30.0 ng/dL 8.6   Renin 7.253 - 6.644 ng/mL/hr 5.355   Aldos/Renin Ratio 0.0 - 30.0 1.6              09/15/2019   10:23 AM  Renovascular   Renal Artery [place] Completed Yes     Disposition:    FU with Dr. Jacques Navy in 6 months    Medication Adjustments/Labs and Tests Ordered: Current medicines are reviewed at length with the patient today.  Concerns regarding medicines are outlined above.  No orders of the defined types were placed in this encounter.  Meds ordered this encounter  Medications   amLODipine (NORVASC) 5 MG tablet    Sig: Take 1 tablet (5 mg total) by mouth in the morning and at bedtime.    Dispense:  180 tablet    Refill:  3    Order Specific Question:   Supervising Provider    Answer:   Jodelle Red [[id number]]    Signed, Alver Sorrow, NP  02/05/2023 9:04 AM    [facility]

## 2023-02-11 ENCOUNTER — Ambulatory Visit (INDEPENDENT_AMBULATORY_CARE_PROVIDER_SITE_OTHER): Payer: 59 | Admitting: Family Medicine

## 2023-02-11 VITALS — BP 120/80 | HR 87 | Temp 97.2°F | Ht 64.5 in | Wt 143.8 lb

## 2023-02-11 DIAGNOSIS — I10 Essential (primary) hypertension: Secondary | ICD-10-CM

## 2023-02-11 DIAGNOSIS — E782 Mixed hyperlipidemia: Secondary | ICD-10-CM

## 2023-02-11 DIAGNOSIS — Z0000 Encounter for general adult medical examination without abnormal findings: Secondary | ICD-10-CM | POA: Insufficient documentation

## 2023-02-11 NOTE — Assessment & Plan Note (Signed)
Well controlled.  No changes to medicines.  Continue to work on eating a healthy diet and exercise.  Labs drawn today.  Continue to follow up with Heart Doctor

## 2023-02-11 NOTE — Assessment & Plan Note (Signed)
Recommend continue to work on eating healthy diet and exercise.  

## 2023-02-11 NOTE — Progress Notes (Signed)
Subjective:  Patient ID: Vicki Rodriguez, female    DOB: 27-Aug-1985  Age: 37 y.o. MRN: [id number]  Chief Complaint  Patient presents with   Annual Exam    HPI Well Adult Physical: Patient here for a comprehensive physical exam.The patient reports no problems Do you take any herbs or supplements that were not prescribed by a doctor? no Are you taking calcium supplements? no Are you taking aspirin daily? no  Encounter for general adult medical examination without abnormal findings  Physical ("At Risk" items are starred): Patient's last physical exam was 1 year ago .  Patient is not afflicted from Stress Incontinence and Urge Incontinence  Patient wears a seat belts Patient has smoke detectors and has carbon monoxide detectors. Patient practices appropriate gun safety. Patient wears sunscreen with extended sun exposure. Dental Care: biannual cleanings, brushes and flosses daily. Ophthalmology/Optometry: Annual visit.  Hearing loss: none Vision impairments: none   Menstrual History: Normal, last about 5 days LMP:  about 2 weeks ago Pregnancy history: 2 full term Safe at home: Yes Self breast exams: Yes     02/11/2023    9:07 AM 12/06/2021    9:59 AM  Depression screen PHQ 2/9  Decreased Interest 0 0  Down, Depressed, Hopeless 0 0  PHQ - 2 Score 0 0  Altered sleeping  0  Tired, decreased energy  0  Change in appetite  0  Feeling bad or failure about yourself   0  Trouble concentrating  0  Moving slowly or fidgety/restless  0  Suicidal thoughts  0  PHQ-9 Score  0  Difficult doing work/chores  Not difficult at all         08/29/2019    4:19 AM 12/06/2021    9:58 AM 02/11/2023    9:07 AM  Fall Risk  Falls in the past year?  0 0  Was there an injury with Fall?  0 0  Fall Risk Category Calculator  0 0  Fall Risk Category (Retired)  Low   (RETIRED) Patient Fall Risk Level Low fall risk Low fall risk   Patient at Risk for Falls Due to  No Fall Risks No Fall Risks  Fall  risk Follow up  Falls evaluation completed Falls evaluation completed             Social Hx   Social History   Socioeconomic History   Marital status: Married    Spouse name: Will Moeser   Number of children: 2   Years of education: Not on file   Highest education level: Bachelor's degree (e.g., BA, AB, BS)  Occupational History   Not on file  Tobacco Use   Smoking status: Never   Smokeless tobacco: Never  Substance and Sexual Activity   Alcohol use: No   Drug use: No   Sexual activity: Yes    Birth control/protection: None    Comment: Husband had vastectomy  Other Topics Concern   Not on file  Social History Narrative   Work or School: Engineer, civil (consulting), 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[employer], full-time, night shifts      Home Situation: Lives with husband and 2 daughters, age 33 and 20 months in November/2018      Spiritual Beliefs: Christian      Lifestyle: No regular exercise, healthy diet   Social Determinants of Health   Financial Resource Strain: Low Risk  (02/11/2023)   Overall Financial Resource Strain (CARDIA)    Difficulty of Paying Living Expenses: Not hard at all  Food Insecurity: No Food Insecurity (02/11/2023)   Hunger Vital Sign    Worried About Running Out of Food in the Last Year: Never true    Ran Out of Food in the Last Year: Never true  Transportation Needs: No Transportation Needs (02/11/2023)   PRAPARE - Administrator, Civil Service (Medical): No    Lack of Transportation (Non-Medical): No  Physical Activity: Insufficiently Active (02/11/2023)   Exercise Vital Sign    Days of Exercise per Week: 2 days    Minutes of Exercise per Session: 20 min  Stress: No Stress Concern Present (02/11/2023)   [employer] of Occupational Health - Occupational Stress Questionnaire    Feeling of Stress : Only a little  Social Connections: Moderately Integrated (02/11/2023)   Social Connection and Isolation Panel [NHANES]    Frequency of Communication with Friends and Family: More  than three times a week    Frequency of Social Gatherings with Friends and Family: Once a week    Attends Religious Services: More than 4 times per year    Active Member of [employer] or Organizations: No    Attends Engineer, structural: Not on file    Marital Status: Married   Past Medical History:  Diagnosis Date   Essential hypertension 10/08/2019   GERD (gastroesophageal reflux disease)    pregnancy related   Hypertension    while in nursing school   Physical exam, annual 02/11/2023   PONV (postoperative nausea and vomiting)    PVC (premature ventricular contraction) 10/08/2019   Seasonal allergies    Past Surgical History:  Procedure Laterality Date   CESAREAN SECTION  2014   x 1   CESAREAN SECTION WITH BILATERAL TUBAL LIGATION Bilateral 07/07/2015    Family History  Problem Relation Age of Onset   Hypertension Father    CAD Father    Hypertension Paternal Aunt    CAD Paternal Uncle    Heart attack Paternal Grandfather 63   CAD Paternal Uncle     Review of Systems  Constitutional:  Negative for fatigue.  HENT:  Negative for congestion, ear pain, sinus pain and sore throat.   Respiratory:  Negative for cough and shortness of breath.   Cardiovascular:  Negative for chest pain.  Gastrointestinal:  Negative for abdominal pain, constipation, diarrhea, nausea and vomiting.  Genitourinary:  Negative for dysuria, frequency and urgency.  Musculoskeletal:  Negative for arthralgias, back pain and myalgias.  Neurological:  Negative for dizziness and headaches.  Psychiatric/Behavioral:  Negative for agitation and sleep disturbance. The patient is not nervous/anxious.      Objective:  BP 120/80 (BP Location: Right Arm, Patient Position: Sitting, Cuff Size: Normal)   Temp (!) 97.2 F (36.2 C) (Temporal)   Ht 5' 4.5" (1.638 m)   Wt 143 lb 12.8 oz (65.2 kg)   LMP 01/28/2023   Breastfeeding No   BMI 24.30 kg/m      02/11/2023    9:04 AM 02/05/2023    8:20 AM 12/12/2022    10:40 AM  BP/Weight  Systolic BP 120 104 118  Diastolic BP 80 66 90  Wt. (Lbs) 143.8 143.4   BMI 24.3 kg/m2 23.86 kg/m2     Physical Exam Constitutional:      General: She is not in acute distress.    Appearance: Normal appearance.  HENT:     Right Ear: Tympanic membrane normal.     Left Ear: Tympanic membrane normal.     Nose: Nose normal.  Mouth/Throat:     Mouth: Mucous membranes are moist.     Pharynx: No posterior oropharyngeal erythema.  Eyes:     Extraocular Movements: Extraocular movements intact.     Conjunctiva/sclera: Conjunctivae normal.     Pupils: Pupils are equal, round, and reactive to light.  Neck:     Vascular: No carotid bruit.  Cardiovascular:     Rate and Rhythm: Normal rate and regular rhythm.     Pulses: Normal pulses.     Heart sounds: Normal heart sounds.  Pulmonary:     Effort: Pulmonary effort is normal.     Breath sounds: Normal breath sounds.  Abdominal:     General: Bowel sounds are normal.  Musculoskeletal:        General: Normal range of motion.  Neurological:     General: No focal deficit present.     Mental Status: She is alert.     Cranial Nerves: Cranial nerves 2-12 are intact.     Motor: Motor function is intact.     Deep Tendon Reflexes: Reflexes are normal and symmetric.  Psychiatric:        Mood and Affect: Mood normal.        Behavior: Behavior normal.     Lab Results  Component Value Date   WBC 6.3 12/06/2021   HGB 14.8 12/06/2021   HCT 43.6 12/06/2021   PLT 257 12/06/2021   GLUCOSE 91 12/21/2022   CHOL 188 12/06/2021   TRIG 53 12/06/2021   HDL 53 12/06/2021   LDLCALC 125 (H) 12/06/2021   ALT 10 12/06/2021   AST 13 12/06/2021   NA 138 12/21/2022   K 4.4 12/21/2022   CL 103 12/21/2022   CREATININE 0.91 12/21/2022   BUN 18 12/21/2022   CO2 23 12/21/2022   TSH 1.470 12/06/2021   HGBA1C 5.0 03/26/2017      Assessment & Plan:  Essential hypertension Assessment & Plan: Well controlled.  No changes  to medicines.  Continue to work on eating a healthy diet and exercise.  Labs drawn today.  Continue to follow up with Heart Doctor   Physical exam, annual Assessment & Plan: Recommend continue to work on eating healthy diet and exercise.       Body mass index is 24.3 kg/m.   These are the goals we discussed:  Goals   None      This is a list of the screening recommended for you and due dates:  Health Maintenance  Topic Date Due   COVID-19 Vaccine (1) Never done   Pap with HPV screening  Never done   Flu Shot  08/05/2023*   DTaP/Tdap/Td vaccine (2 - Td or Tdap) 04/14/2025   HIV Screening  Completed   HPV Vaccine  Aged Out  *Topic was postponed. The date shown is not the original due date.     No orders of the defined types were placed in this encounter.   Follow-up: Return in about 6 months (around 08/12/2023).  An After Visit Summary was printed and given to the patient.   Renne Crigler, FNP [facility] [phone]

## 2023-02-13 LAB — CMP14+EGFR
ALT: 13 [IU]/L (ref 0–32)
AST: 14 [IU]/L (ref 0–40)
Albumin: 4.6 g/dL (ref 3.9–4.9)
Alkaline Phosphatase: 58 [IU]/L (ref 44–121)
BUN/Creatinine Ratio: 16 (ref 9–23)
BUN: 16 mg/dL (ref 6–20)
Bilirubin Total: 0.3 mg/dL (ref 0.0–1.2)
CO2: 23 mmol/L (ref 20–29)
Calcium: 9.6 mg/dL (ref 8.7–10.2)
Chloride: 101 mmol/L (ref 96–106)
Creatinine, Ser: 1.02 mg/dL — ABNORMAL HIGH (ref 0.57–1.00)
Globulin, Total: 2.3 g/dL (ref 1.5–4.5)
Glucose: 97 mg/dL (ref 70–99)
Potassium: 4.9 mmol/L (ref 3.5–5.2)
Sodium: 137 mmol/L (ref 134–144)
Total Protein: 6.9 g/dL (ref 6.0–8.5)
eGFR: 73 mL/min/{1.73_m2} (ref >59)

## 2023-02-13 LAB — CBC WITH DIFFERENTIAL/PLATELET
Basophils Absolute: 0.1 10*3/uL (ref 0.0–0.2)
Basos: 1 %
EOS (ABSOLUTE): 0.4 10*3/uL (ref 0.0–0.4)
Eos: 4 %
Hematocrit: 44.1 % (ref 34.0–46.6)
Hemoglobin: 13.9 g/dL (ref 11.1–15.9)
Immature Grans (Abs): 0 10*3/uL (ref 0.0–0.1)
Immature Granulocytes: 0 %
Lymphocytes Absolute: 1.8 10*3/uL (ref 0.7–3.1)
Lymphs: 21 %
MCH: 28.5 pg (ref 26.6–33.0)
MCHC: 31.5 g/dL (ref 31.5–35.7)
MCV: 90 fL (ref 79–97)
Monocytes Absolute: 0.7 10*3/uL (ref 0.1–0.9)
Monocytes: 8 %
Neutrophils Absolute: 5.6 10*3/uL (ref 1.4–7.0)
Neutrophils: 66 %
Platelets: 262 10*3/uL (ref 150–450)
RBC: 4.88 x10E6/uL (ref 3.77–5.28)
RDW: 13.7 % (ref 11.7–15.4)
WBC: 8.6 10*3/uL (ref 3.4–10.8)

## 2023-02-13 LAB — LIPID PANEL
Chol/HDL Ratio: 3.4 {ratio} (ref 0.0–4.4)
Cholesterol, Total: 166 mg/dL (ref 100–199)
HDL: 49 mg/dL (ref >39)
LDL Chol Calc (NIH): 102 mg/dL — ABNORMAL HIGH (ref 0–99)
Triglycerides: 78 mg/dL (ref 0–149)
VLDL Cholesterol Cal: 15 mg/dL (ref 5–40)

## 2023-02-13 LAB — LIPOPROTEIN A (LPA): Lipoprotein (a): 139 nmol/L — ABNORMAL HIGH (ref <75.0)

## 2023-02-14 ENCOUNTER — Encounter (HOSPITAL_BASED_OUTPATIENT_CLINIC_OR_DEPARTMENT_OTHER): Payer: Self-pay

## 2023-04-02 ENCOUNTER — Other Ambulatory Visit (HOSPITAL_COMMUNITY): Payer: Self-pay

## 2023-04-29 ENCOUNTER — Telehealth: Payer: 59 | Admitting: Physician Assistant

## 2023-04-29 DIAGNOSIS — J208 Acute bronchitis due to other specified organisms: Secondary | ICD-10-CM

## 2023-04-29 MED ORDER — BENZONATATE 100 MG PO CAPS
100.0000 mg | ORAL_CAPSULE | Freq: Three times a day (TID) | ORAL | 0 refills | Status: AC | PRN
Start: 2023-04-29 — End: ?

## 2023-04-29 NOTE — Patient Instructions (Signed)
  Afsa Janalyn Harder, thank you for joining [name], PA-C for today's virtual visit.  While this provider is not your primary care provider (PCP), if your PCP is located in our provider database this encounter information will be shared with them immediately following your visit.   A [facility] MyChart account gives you access to today's visit and all your visits, tests, and labs performed at [facility] " click here if you don't have a [facility] MyChart account or go to [email or web address]  Consent: (Patient) Vicki Rodriguez provided verbal consent for this virtual visit at the beginning of the encounter.  Current Medications:  Current Outpatient Medications:    benzonatate (TESSALON) 100 MG capsule, Take 1 capsule (100 mg total) by mouth 3 (three) times daily as needed for cough., Disp: 30 capsule, Rfl: 0   amLODipine (NORVASC) 5 MG tablet, Take 1 tablet (5 mg total) by mouth in the morning and at bedtime., Disp: 180 tablet, Rfl: 3   losartan (COZAAR) 25 MG tablet, Take 1 tablet (25 mg total) by mouth daily., Disp: 90 tablet, Rfl: 3   Multiple Vitamin (MULTIVITAMIN WITH MINERALS) TABS tablet, Take 1 tablet by mouth daily., Disp: , Rfl:    Medications ordered in this encounter:  Meds ordered this encounter  Medications   benzonatate (TESSALON) 100 MG capsule    Sig: Take 1 capsule (100 mg total) by mouth 3 (three) times daily as needed for cough.    Dispense:  30 capsule    Refill:  0    Supervising Provider:   Merrilee Jansky [[id number]]     *If you need refills on other medications prior to your next appointment, please contact your pharmacy*  Follow-Up: Call back or seek an in-person evaluation if the symptoms worsen or if the condition fails to improve as anticipated.  [facility] [phone]  Other Instructions Please keep hydrated and rest. Run a humidifier in the bedroom at night. Take the Tessalon as directed. If  symptoms are not continuing to improve/resolve or any new or worsening symptoms, please let us or your PCP know ASAP.   If you have been instructed to have an in-person evaluation today at a local Urgent Care facility, please use the link below. It will take you to a list of all of our available [facility], including address, phone number and hours of operation. Please do not delay care.  [facility]  If you or a family member do not have a primary care provider, use the link below to schedule a visit and establish care. When you choose a [facility] primary care physician or advanced practice provider, you gain a long-term partner in health. Find a Primary Care Provider  Learn more about [facility]'s in-office and virtual care options: [facility] - Get Care Now

## 2023-04-29 NOTE — Progress Notes (Signed)
Virtual Visit Consent   Vicki Rodriguez, you are scheduled for a virtual visit with a [facility] provider today. Just as with appointments in the office, your consent must be obtained to participate. Your consent will be active for this visit and any virtual visit you may have with one of our providers in the next 365 days. If you have a MyChart account, a copy of this consent can be sent to you electronically.  As this is a virtual visit, video technology does not allow for your provider to perform a traditional examination. This may limit your provider's ability to fully assess your condition. If your provider identifies any concerns that need to be evaluated in person or the need to arrange testing (such as labs, EKG, etc.), we will make arrangements to do so. Although advances in technology are sophisticated, we cannot ensure that it will always work on either your end or our end. If the connection with a video visit is poor, the visit may have to be switched to a telephone visit. With either a video or telephone visit, we are not always able to ensure that we have a secure connection.  By engaging in this virtual visit, you consent to the provision of healthcare and authorize for your insurance to be billed (if applicable) for the services provided during this visit. Depending on your insurance coverage, you may receive a charge related to this service.  I need to obtain your verbal consent now. Are you willing to proceed with your visit today? Vicki Rodriguez has provided verbal consent on 04/29/2023 for a virtual visit (video or telephone). [name], [place]  Date: 04/29/2023 4:52 PM  Virtual Visit via Video Note   I, [name], connected with  Vicki Rodriguez  ([id number], [birth year]) on 04/29/23 at  5:00 PM EST by a video-enabled telemedicine application and verified that I am speaking with the correct person using two identifiers.  Location: Patient: Virtual  Visit Location Patient: Home Provider: Virtual Visit Location Provider: Home Office   I discussed the limitations of evaluation and management by telemedicine and the availability of in person appointments. The patient expressed understanding and agreed to proceed.    History of Present Illness: Vicki Rodriguez is a 37 y.o. who identifies as a female who was assigned female at birth, and is being seen today for nasal congestion, sinus pressure and chest congestion with cough for the past 3 weeks. Notes that sinus pressure and congestion has improved but the cough has lingered. Cough is mainly dry but persistent. Denies chest pain.   OTC -- honey. Cannot tolerate most OTC cough syrups.   HPI: HPI  Problems:  Patient Active Problem List   Diagnosis Date Noted   Physical exam, annual 02/11/2023   Essential hypertension 10/08/2019   PVC (premature ventricular contraction) 10/08/2019   S/P cesarean section 07/07/2015    Allergies: No Known Allergies Medications:  Current Outpatient Medications:    benzonatate (TESSALON) 100 MG capsule, Take 1 capsule (100 mg total) by mouth 3 (three) times daily as needed for cough., Disp: 30 capsule, Rfl: 0   amLODipine (NORVASC) 5 MG tablet, Take 1 tablet (5 mg total) by mouth in the morning and at bedtime., Disp: 180 tablet, Rfl: 3   losartan (COZAAR) 25 MG tablet, Take 1 tablet (25 mg total) by mouth daily., Disp: 90 tablet, Rfl: 3   Multiple Vitamin (MULTIVITAMIN WITH MINERALS) TABS tablet, Take 1 tablet by mouth daily., Disp: ,  Rfl:   Observations/Objective: Patient is well-developed, well-nourished in no acute distress.  Resting comfortably at home.  Head is normocephalic, atraumatic.  No labored breathing. Speech is clear and coherent with logical content.  Patient is alert and oriented at baseline.   Assessment and Plan: 1. Viral bronchitis (Primary) - benzonatate (TESSALON) 100 MG capsule; Take 1 capsule (100 mg total) by mouth 3 (three)  times daily as needed for cough.  Dispense: 30 capsule; Refill: 0  Supportive measures and OTC medications reviewed. Lingering cough after bronchitis. Start Tessalon per orders. Follow-up in person if not continuing to resolve.  Follow Up Instructions: I discussed the assessment and treatment plan with the patient. The patient was provided an opportunity to ask questions and all were answered. The patient agreed with the plan and demonstrated an understanding of the instructions.  A copy of instructions were sent to the patient via MyChart unless otherwise noted below.   The patient was advised to call back or seek an in-person evaluation if the symptoms worsen or if the condition fails to improve as anticipated.    [name], PA-C

## 2023-05-15 ENCOUNTER — Other Ambulatory Visit: Payer: Self-pay

## 2023-05-16 ENCOUNTER — Other Ambulatory Visit (HOSPITAL_COMMUNITY): Payer: Self-pay

## 2023-08-12 ENCOUNTER — Other Ambulatory Visit (HOSPITAL_COMMUNITY): Payer: Self-pay

## 2023-12-03 ENCOUNTER — Other Ambulatory Visit (HOSPITAL_COMMUNITY): Payer: Self-pay

## 2024-02-11 ENCOUNTER — Other Ambulatory Visit (HOSPITAL_COMMUNITY): Payer: Self-pay

## 2024-02-11 MED ORDER — FLUZONE 0.5 ML IM SUSY
0.5000 mL | PREFILLED_SYRINGE | Freq: Once | INTRAMUSCULAR | 0 refills | Status: AC
  Filled 2024-02-11: qty 0.5, 1d supply, fill #0

## 2024-03-05 ENCOUNTER — Other Ambulatory Visit (HOSPITAL_COMMUNITY): Payer: Self-pay

## 2024-03-05 ENCOUNTER — Telehealth: Payer: Self-pay | Admitting: Internal Medicine

## 2024-03-05 DIAGNOSIS — I10 Essential (primary) hypertension: Secondary | ICD-10-CM

## 2024-03-05 MED ORDER — AMLODIPINE BESYLATE 5 MG PO TABS
5.0000 mg | ORAL_TABLET | Freq: Two times a day (BID) | ORAL | 0 refills | Status: DC
  Filled 2024-03-05: qty 180, 90d supply, fill #0

## 2024-03-05 MED ORDER — LOSARTAN POTASSIUM 25 MG PO TABS
25.0000 mg | ORAL_TABLET | Freq: Every day | ORAL | 0 refills | Status: DC
  Filled 2024-03-05: qty 90, 90d supply, fill #0

## 2024-03-05 NOTE — Telephone Encounter (Signed)
 Pt's medications were sent to pt's pharmacy as requested. Confirmation received.

## 2024-03-05 NOTE — Telephone Encounter (Signed)
*  STAT* If patient is at the pharmacy, call can be transferred to refill team.   1. Which medications need to be refilled? (please list name of each medication and dose if known)   amLODipine  (NORVASC ) 5 MG tablet    losartan  (COZAAR ) 25 MG tablet (Expired)    2. Which pharmacy/location (including street and city if local pharmacy) is medication to be sent to? [facility]   3. Do they need a 30 day or 90 day supply? 90 .  Patient has appt 11/20

## 2024-03-25 ENCOUNTER — Telehealth: Payer: Self-pay | Admitting: Internal Medicine

## 2024-03-25 ENCOUNTER — Encounter: Payer: Self-pay | Admitting: *Deleted

## 2024-03-25 ENCOUNTER — Encounter: Payer: Self-pay | Admitting: Internal Medicine

## 2024-03-25 NOTE — Telephone Encounter (Signed)
 Spoke with patient regarding her lab work and if she needed to fast. 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 If not, lab orders could be put in tomorrow and she could get the lab work done anytime at [facility] on her day off. Pt verbalized understanding and opportunities given for questions.  Thank you,  [place], RN [facility] Triage

## 2024-03-25 NOTE — Telephone Encounter (Signed)
 Pt called in asking if she needs to fast prior to her appt tomorrow morning, please advise. She works night shift at [employer] so if she does she would like to know how long does she need to go without eating.

## 2024-03-26 ENCOUNTER — Other Ambulatory Visit (HOSPITAL_COMMUNITY): Payer: Self-pay

## 2024-03-26 ENCOUNTER — Ambulatory Visit: Attending: Internal Medicine | Admitting: Internal Medicine

## 2024-03-26 VITALS — BP 128/80 | HR 70 | Ht 65.0 in | Wt 151.8 lb

## 2024-03-26 DIAGNOSIS — Z79899 Other long term (current) drug therapy: Secondary | ICD-10-CM

## 2024-03-26 DIAGNOSIS — I10 Essential (primary) hypertension: Secondary | ICD-10-CM | POA: Diagnosis not present

## 2024-03-26 DIAGNOSIS — I493 Ventricular premature depolarization: Secondary | ICD-10-CM | POA: Diagnosis not present

## 2024-03-26 DIAGNOSIS — E782 Mixed hyperlipidemia: Secondary | ICD-10-CM | POA: Diagnosis not present

## 2024-03-26 LAB — LIPID PANEL
Chol/HDL Ratio: 3.6 ratio (ref 0.0–4.4)
Cholesterol, Total: 184 mg/dL (ref 100–199)
HDL: 51 mg/dL (ref >39)
LDL Chol Calc (NIH): 123 mg/dL — ABNORMAL HIGH (ref 0–99)
Triglycerides: 50 mg/dL (ref 0–149)
VLDL Cholesterol Cal: 10 mg/dL (ref 5–40)

## 2024-03-26 MED ORDER — AMLODIPINE BESYLATE 5 MG PO TABS
5.0000 mg | ORAL_TABLET | Freq: Two times a day (BID) | ORAL | 3 refills | Status: AC
  Filled 2024-03-26: qty 180, 90d supply, fill #0

## 2024-03-26 MED ORDER — LOSARTAN POTASSIUM 25 MG PO TABS
25.0000 mg | ORAL_TABLET | Freq: Every day | ORAL | 3 refills | Status: AC
  Filled 2024-03-26: qty 90, 90d supply, fill #0

## 2024-03-26 NOTE — Progress Notes (Signed)
  Cardiology Office Note:  .   Date:  03/26/2024  ID:  Vicki Rodriguez, DOB 1985-06-11, MRN [id number] PCP: Sherre Clapper, MD  [facility] Providers Cardiologist:  Soyla DELENA Merck, MD    History of Present Illness: .   Vicki Rodriguez is a 38 y.o. female.  Discussed the use of AI scribe software for clinical note transcription with the patient, who gave verbal consent to proceed.  History of Present Illness Vicki Rodriguez is a 38 year old female with hypertension who presents for a cardiovascular follow-up.  She is asymptomatic with no premature ventricular contractions, previously linked to her menstrual cycle. Stress occasionally triggers PVCs. She is not on medication for PVCs.  She takes amlodipine  5 mg daily and losartan  25 mg daily for hypertension. Home blood pressure readings average 128/80 mmHg.  She has mildly elevated lipoprotein A. Last year, her LDL was 102 mg/dL, and she is not on cholesterol-lowering medication.  No chest pain or shortness of breath. No new symptoms since her last visit.    ROS: negative except per HPI above.  Studies Reviewed: SABRA   EKG Interpretation Date/Time:  Thursday March 26 2024 08:02:34 EST Ventricular Rate:  70 PR Interval:  140 QRS Duration:  92 QT Interval:  404 QTC Calculation: 436 R Axis:   69  Text Interpretation: Normal sinus rhythm Normal ECG Confirmed by Merck Soyla ([id number]) on 03/26/2024 8:26:10 AM    Results LABS Lipoprotein A: Mildly elevated LDL: 102 mg/dL  DIAGNOSTIC ECG: Normal Risk Assessment/Calculations:       Physical Exam:   VS:  BP 128/80 (BP Location: Left Arm, Patient Position: Sitting, Cuff Size: Normal)   Pulse 70   Ht 5' 5 (1.651 m)   Wt 151 lb 12.8 oz (68.9 kg)   SpO2 97%   BMI 25.26 kg/m    Wt Readings from Last 3 Encounters:  03/26/24 151 lb 12.8 oz (68.9 kg)  02/11/23 143 lb 12.8 oz (65.2 kg)  02/05/23 143 lb 6.4 oz (65 kg)     Physical Exam VITALS: BP-  128/80 GENERAL: Alert, cooperative, well developed, no acute distress HEENT: Normocephalic, normal oropharynx, moist mucous membranes CHEST: Clear to auscultation bilaterally, no wheezes, rhonchi, or crackles CARDIOVASCULAR: Normal heart rate and rhythm, S1 and S2 normal without murmurs ABDOMEN: Soft, non-tender, non-distended, without organomegaly, normal bowel sounds EXTREMITIES: No cyanosis or edema NEUROLOGICAL: Cranial nerves grossly intact, moves all extremities without gross motor or sensory deficit   ASSESSMENT AND PLAN: .    Assessment and Plan Assessment & Plan Essential hypertension Med management Blood pressure well-controlled with current regimen. Home readings consistent with clinic readings. No fluid retention or edema. - Continue amlodipine  5 mg daily. - Continue losartan  25 mg daily.  - Monitor blood pressure regularly at home. - Refilled losartan  and amlodipine  prescriptions.  Mixed hyperlipidemia with mildly elevated lipoprotein(a) LDL cholesterol at 102 mg/dL, acceptable for age. Mildly elevated lipoprotein(a) indicates genetic cardiovascular risk. Discussed potential future therapies and aspirin therapy if levels increase. - Aim to reduce LDL cholesterol to less than 70 mg/dL through diet. - Ordered lipid panel today. - Monitor triglycerides; repeat if abnormal. - Will discuss potential future therapies for lipoprotein(a) as she approaches 39.  Premature ventricular contractions No recent PVC episodes. Previously linked to menstrual cycle and stress. Blood pressure control may have improved condition. - No current need for medication for PVCs.        Soyla Merck, MD, FACC

## 2024-03-26 NOTE — Patient Instructions (Signed)
 Medication Instructions:  No Changes *If you need a refill on your cardiac medications before your next appointment, please call your pharmacy*  Lab Work: Today: Fasting Lipid Panel  Testing/Procedures: None  Follow-Up: At [facility], you and your health needs are our priority.  As part of our continuing mission to provide you with exceptional heart care, our providers are all part of one team.  This team includes your primary Cardiologist (physician) and Advanced Practice Providers or APPs (Physician Assistants and Nurse Practitioners) who all work together to provide you with the care you need, when you need it.  Your next appointment:   1 year(s)  Provider:   Gayatri A Acharya, MD   Other Instructions Please call us  or send a MyChart message with any Cardiology related questions/concerns.  [phone].  Thank you!

## 2024-04-05 ENCOUNTER — Other Ambulatory Visit (HOSPITAL_COMMUNITY): Payer: Self-pay

## 2024-04-07 ENCOUNTER — Other Ambulatory Visit (HOSPITAL_COMMUNITY): Payer: Self-pay

## 2024-05-31 ENCOUNTER — Ambulatory Visit: Payer: Self-pay | Admitting: Internal Medicine
